# Patient Record
Sex: Female | Born: 1970 | Race: White | Hispanic: No | Smoking: Current every day smoker
Health system: Southern US, Community
[De-identification: ages and names within clinical notes are randomized; demographics above are authoritative.]

## PROBLEM LIST (undated history)

## (undated) DIAGNOSIS — D649 Anemia, unspecified: Secondary | ICD-10-CM

## (undated) DIAGNOSIS — R569 Unspecified convulsions: Secondary | ICD-10-CM

## (undated) DIAGNOSIS — G510 Bell's palsy: Secondary | ICD-10-CM

## (undated) HISTORY — PX: TUBAL LIGATION: SHX77

## (undated) HISTORY — PX: TONSILLECTOMY: SUR1361

---

## 2005-07-21 ENCOUNTER — Encounter: Admission: RE | Admit: 2005-07-21 | Discharge: 2005-07-21 | Payer: Self-pay | Admitting: Occupational Medicine

## 2006-06-14 ENCOUNTER — Inpatient Hospital Stay (HOSPITAL_COMMUNITY): Admission: RE | Admit: 2006-06-14 | Discharge: 2006-06-16 | Payer: Self-pay | Admitting: *Deleted

## 2006-06-15 ENCOUNTER — Encounter (INDEPENDENT_AMBULATORY_CARE_PROVIDER_SITE_OTHER): Payer: Self-pay | Admitting: *Deleted

## 2010-07-03 ENCOUNTER — Ambulatory Visit (HOSPITAL_COMMUNITY): Admission: RE | Admit: 2010-07-03 | Discharge: 2010-07-03 | Payer: Self-pay | Admitting: Family Medicine

## 2011-01-09 NOTE — Discharge Summary (Signed)
Brittany Hull, Brittany Hull               ACCOUNT NO.:  [id number]   MEDICAL RECORD NO.:  [id number]          PATIENT TYPE:  INP   LOCATION:  9109                          FACILITY:  [facility]   PHYSICIAN:  Zenaida Niece, M.D.DATE OF BIRTH:  1970-09-28   DATE OF ADMISSION:  06/14/2006  DATE OF DISCHARGE:  06/16/2006                                 DISCHARGE SUMMARY   ADMISSION DIAGNOSES:  1. Intrauterine pregnancy at 40 weeks.  2. Advanced maternal age.  3. Desires surgical sterility.   DISCHARGE DIAGNOSES:  1. Intrauterine pregnancy at 40 weeks.  2. Advanced maternal age.  3. Desires surgical sterility.  4. Meconium stained amniotic fluid.   PROCEDURES:  On June 14, 2006, she had a spontaneous vaginal delivery and  on June 15, 2006 she had a postpartum tubal ligation.   HISTORY AND PHYSICAL:  This is a 39 year old white female, gravida 4, para 2-  1-0-2 with an EGA of [redacted] weeks who presents for elective induction.  Prenatal  care complicated by the fact that she transferred to our group at  approximately 15 weeks.  She had advanced maternal age with normal  ultrasound and normal quad screen and declined amniocentesis.  She has a  history of having a baby with spina bifida but had normal ultrasound.  She  had recent size less than dates with reactive nonstress test.   PRENATAL LABS:  Blood type is A positive with a negative antibody screen.  RPR nonreactive.  Rubella immuned.  Hepatitis B surface antigen negative.  HIV negative.  Gonorrhea and Chlamydia negative.  Quad screen is normal.  One-hour Glucola is 58 and group B strep is negative.   PAST OB HISTORY:  Two vaginal deliveries at term without complications that  weighed 9 pounds 8 ounces and 8 pounds 1 ounce.  She also had one vaginal  delivery at approximately 5-1/2 months that was a stillbirth with spina  bifida.   PAST HISTORY:  Essentially noncontributory.   PHYSICAL EXAMINATION:  She is afebrile with stable vital  signs.  Fetal heart  tracing is reactive with irregular contractions.  Abdomen is gravid and  nontender with an estimated fetal weight of seven pounds.  Cervix is 3+, 60,  -1 to -2 with vertex presentation and adequate pelvis.  Amniotomy reveals  moderate meconium.   HOSPITAL COURSE:  The patient was admitted and had amniotomy performed for  induction.  She progressed into labor and received an epidural.  She  progressed to complete, pushed well and on the afternoon of June 14, 2006  had a vaginal delivery of a viable female infant with Apgar's of 9 and 9 that  weighed 7 pounds 11 ounces.  Dr. Francine Graven attended the delivery and the  baby did fine.  Placenta delivered spontaneously, was intact and was sent  for cord blood collection.  She had two small perineal abrasions which were  hemostatic and not repaired.  She had a 2 cm posterior vaginal cyst which  ruptured sebaceous material with delivery.  Estimated blood loss was 500 mL.   Postpartum, she had no significant complications  and did wish to proceed  with tubal ligation.  Predelivery hemoglobin 13.4, postdelivery 11.1.  On  the afternoon of postpartum day #2, Dr. Ellyn Hack did perform postpartum tubal  ligation under epidural anesthesia without complications.  On the morning of  postpartum day #2, she was felt to be stable enough for discharge home.   DISCHARGE INSTRUCTIONS:  1. Regular diet.  2. Pelvic rest.  3. Follow up in six weeks.   MEDICATIONS:  1. Percocet #20 1-2 p.o. q.4-6h. p.r.n. pain  2. Over-the-counter ibuprofen as needed.  She is given our discharge      pamphlet.      Zenaida Niece, M.D.  Electronically Signed     TDM/MEDQ  D:  06/16/2006  T:  06/17/2006  Job:  [id number]

## 2011-01-09 NOTE — Op Note (Signed)
Brittany Hull, Brittany Hull               ACCOUNT NO.:  [id number]   MEDICAL RECORD NO.:  [id number]          PATIENT TYPE:  INP   LOCATION:  9109                          FACILITY:  [facility]   PHYSICIAN:  Sherron Monday, MD        DATE OF BIRTH:  05-Apr-1971   DATE OF PROCEDURE:  06/15/2006  DATE OF DISCHARGE:                                 OPERATIVE REPORT   PREOPERATIVE DIAGNOSIS:  Undesired fertility, multiparity.   POSTOPERATIVE DIAGNOSIS:  Undesired fertility, multiparity.   OPERATION PERFORMED:  Postpartum bilateral tubal ligation by Pomeroy method.   SURGEON:  Sherron Monday, MD   ANESTHESIA:  Epidural.   COMPLICATIONS:  None.   PATHOLOGY:  Right tubal segment, left tubal segment.   FINDINGS:  Bilateral normal tubes and ovaries, normal postpartum uterus.   ESTIMATED BLOOD LOSS:  Minimal.   IV FLUIDS:  1100 mL.   URINE OUTPUT:  100 mL by in and out cath.   DISPOSITION:  Stable to PACU.   DESCRIPTION OF PROCEDURE:  After informed consent was reviewed with the  patient including risks, benefits and alternatives of the procedure, as well  as a less than 1 out of 200 risk of failure, patient was taken to the  operating room after her epidural had been redosed.  She was then prepped  and draped in the normal sterile fashion.  An approximately 2 cm  infraumbilical incision was made, carried through the underlying layer of  fascia sharply.  The fascia was then elevated with Kocher clamps and incised  using Mayo scissors.  This incision was extended and the peritoneum was  easily identified.  Using a moist tape sponge, the bowels were packed away.  The tube was easily identified, followed out to the fimbriated end.  Using  the Pomeroy method the tube was doubly ligated with two sutures of 0 plain  gut.  The tubal segment was excised and sent to pathology.  Hemostasis was  assured.  This tube was then returned to the abdomen.  The tape sponge was  used to again pack away the bowel and the  right tube was easily identified  and followed out to the fimbriated end and in similar fashion was ligated  using Pomeroy method.  It was doubly ligated using 0 plain gut and the  intervening portion was excised and sent to pathology.  The pack was  removed from the bowel.  The fascia was reapproximated using 0 Vicryl.  The  skin was closed using 3-0 Vicryl.  The patient tolerated the procedure well.  Sponge, lap and needle counts were correct x2 at the end of the procedure.  At the end of the procedure she was taken in stable condition to the PACU.      Sherron Monday, MD  Electronically Signed     JB/MEDQ  D:  06/15/2006  T:  06/16/2006  Job:  [id number]

## 2011-06-02 ENCOUNTER — Other Ambulatory Visit (HOSPITAL_COMMUNITY): Payer: Self-pay | Admitting: Family Medicine

## 2011-06-02 DIAGNOSIS — Z1231 Encounter for screening mammogram for malignant neoplasm of breast: Secondary | ICD-10-CM

## 2011-07-06 ENCOUNTER — Ambulatory Visit (HOSPITAL_COMMUNITY): Payer: Self-pay | Attending: Family Medicine

## 2014-07-15 ENCOUNTER — Encounter (HOSPITAL_COMMUNITY): Payer: Self-pay | Admitting: Family Medicine

## 2014-07-15 ENCOUNTER — Emergency Department (HOSPITAL_COMMUNITY): Payer: Self-pay

## 2014-07-15 ENCOUNTER — Emergency Department (HOSPITAL_COMMUNITY)
Admission: EM | Admit: 2014-07-15 | Discharge: 2014-07-15 | Disposition: A | Discharge status: Home / Self Care | Payer: Self-pay | Attending: Emergency Medicine | Admitting: Emergency Medicine

## 2014-07-15 DIAGNOSIS — G510 Bell's palsy: Secondary | ICD-10-CM | POA: Insufficient documentation

## 2014-07-15 DIAGNOSIS — R531 Weakness: Secondary | ICD-10-CM | POA: Insufficient documentation

## 2014-07-15 DIAGNOSIS — Z720 Tobacco use: Secondary | ICD-10-CM | POA: Insufficient documentation

## 2014-07-15 DIAGNOSIS — Z3202 Encounter for pregnancy test, result negative: Secondary | ICD-10-CM | POA: Insufficient documentation

## 2014-07-15 LAB — CBC
HCT: 42.3 % (ref 36.0–46.0)
Hemoglobin: 14.8 g/dL (ref 12.0–15.0)
MCH: 31.8 pg (ref 26.0–34.0)
MCHC: 35 g/dL (ref 30.0–36.0)
MCV: 90.8 fL (ref 78.0–100.0)
PLATELETS: 231 10*3/uL (ref 150–400)
RBC: 4.66 MIL/uL (ref 3.87–5.11)
RDW: 12.9 % (ref 11.5–15.5)
WBC: 7.5 10*3/uL (ref 4.0–10.5)

## 2014-07-15 LAB — COMPREHENSIVE METABOLIC PANEL
ALT: 9 U/L (ref 0–35)
AST: 12 U/L (ref 0–37)
Albumin: 4.3 g/dL (ref 3.5–5.2)
Alkaline Phosphatase: 51 U/L (ref 39–117)
Anion gap: 14 (ref 5–15)
BUN: 12 mg/dL (ref 6–23)
CO2: 24 mEq/L (ref 19–32)
Calcium: 9.5 mg/dL (ref 8.4–10.5)
Chloride: 102 mEq/L (ref 96–112)
Creatinine, Ser: 0.76 mg/dL (ref 0.50–1.10)
GFR calc non Af Amer: >90 mL/min (ref >90)
Glucose, Bld: 85 mg/dL (ref 70–99)
Potassium: 3.9 mEq/L (ref 3.7–5.3)
Sodium: 140 mEq/L (ref 137–147)
Total Bilirubin: 0.5 mg/dL (ref 0.3–1.2)
Total Protein: 7.2 g/dL (ref 6.0–8.3)

## 2014-07-15 LAB — DIFFERENTIAL
BASOS PCT: 0 % (ref 0–1)
Basophils Absolute: 0 10*3/uL (ref 0.0–0.1)
EOS PCT: 3 % (ref 0–5)
Eosinophils Absolute: 0.2 10*3/uL (ref 0.0–0.7)
LYMPHS ABS: 1.9 10*3/uL (ref 0.7–4.0)
Lymphocytes Relative: 25 % (ref 12–46)
Monocytes Absolute: 0.3 10*3/uL (ref 0.1–1.0)
Monocytes Relative: 4 % (ref 3–12)
Neutro Abs: 5 10*3/uL (ref 1.7–7.7)
Neutrophils Relative %: 68 % (ref 43–77)

## 2014-07-15 LAB — CBG MONITORING, ED: Glucose-Capillary: 84 mg/dL (ref 70–99)

## 2014-07-15 LAB — PROTIME-INR
INR: 1.11 (ref 0.00–1.49)
Prothrombin Time: 14.4 seconds (ref 11.6–15.2)

## 2014-07-15 LAB — I-STAT TROPONIN, ED: Troponin i, poc: 0.01 ng/mL (ref 0.00–0.08)

## 2014-07-15 LAB — APTT: aPTT: 33 seconds (ref 24–37)

## 2014-07-15 LAB — PREGNANCY, URINE: Preg Test, Ur: NEGATIVE

## 2014-07-15 MED ORDER — ARTIFICIAL TEARS OP OINT: TOPICAL_OINTMENT | OPHTHALMIC | Status: DC | PRN

## 2014-07-15 NOTE — ED Notes (Signed)
Pt having left facial numbness since 3 days ago. sts when she goes to drink the liquid drips.  Slight facial droop. Pt grip slightly weaker on the right. sts she has been having HA lately.

## 2014-07-15 NOTE — Discharge Instructions (Signed)
Bell's Palsy °Bell's palsy is a condition in which the muscles on one side of the face cannot move (paralysis). This is because the nerves in the face are paralyzed. It is most often thought to be caused by a virus. The virus causes swelling of the nerve that controls movement on one side of the face. The nerve travels through a tight space surrounded by bone. When the nerve swells, it can be compressed by the bone. This results in damage to the protective covering around the nerve. This damage interferes with how the nerve communicates with the muscles of the face. As a result, it can cause weakness or paralysis of the facial muscles.  °Injury (trauma), tumor, and surgery may cause Bell's palsy, but most of the time the cause is unknown. It is a relatively common condition. It starts suddenly (abrupt onset) with the paralysis usually ending within 2 days. Bell's palsy is not dangerous. But because the eye does not close properly, you may need care to keep the eye from getting dry. This can include splinting (to keep the eye shut) or moistening with artificial tears. Bell's palsy very seldom occurs on both sides of the face at the same time. °SYMPTOMS  °· Eyebrow sagging. °· Drooping of the eyelid and corner of the mouth. °· Inability to close one eye. °· Loss of taste on the front of the tongue. °· Sensitivity to loud noises. °TREATMENT  °The treatment is usually non-surgical. If the patient is seen within the first 24 to 48 hours, a short course of steroids may be prescribed, in an attempt to shorten the length of the condition. Antiviral medicines may also be used with the steroids, but it is unclear if they are helpful.  °You will need to protect your eye, if you cannot close it. The cornea (clear covering over your eye) will become dry and can be damaged. Artificial tears can be used to keep your eye moist. Glasses or an eye patch should be worn to protect your eye. °PROGNOSIS  °Recovery is variable, ranging  from days to months. Although the problem usually goes away completely (about 80% of cases resolve), predicting the outcome is impossible. Most people improve within 3 weeks of when the symptoms began. Improvement may continue for 3 to 6 months. A small number of people have moderate to severe weakness that is permanent.  °HOME CARE INSTRUCTIONS  °· If your caregiver prescribed medication to reduce swelling in the nerve, use as directed. Do not stop taking the medication unless directed by your caregiver. °· Use moisturizing eye drops as needed to prevent drying of your eye, as directed by your caregiver. °· Protect your eye, as directed by your caregiver. °· Use facial massage and exercises, as directed by your caregiver. °· Perform your normal activities, and get your normal rest. °SEEK IMMEDIATE MEDICAL CARE IF:  °· There is pain, redness or irritation in the eye. °· You or your child has an oral temperature above 102° F (38.9° C), not controlled by medicine. °MAKE SURE YOU:  °· Understand these instructions. °· Will watch your condition. °· Will get help right away if you are not doing well or get worse. °Document Released: 08/10/2005 Document Revised: 11/02/2011 Document Reviewed: 11/17/2013 °ExitCare® Patient Information ©2015 [employer]. This information is not intended to replace advice given to you by your health care provider. Make sure you discuss any questions you have with your health care provider. ° °

## 2014-07-15 NOTE — ED Provider Notes (Signed)
CSN: [id number]     Arrival date & time 07/15/14  1650 History   First MD Initiated Contact with Patient 07/15/14 1719     Chief Complaint  Patient presents with  . Numbness     (Consider location/radiation/quality/duration/timing/severity/associated sxs/prior Treatment) HPI Comments: Patient here with 3 day history of left-sided facial numbness. Notes change in sensation of taste. Also notes trouble closing her left eye. Denies any weakness in her arms or legs. Has had some sinus headache. No neck pain. No fever or chills. No nausea vomiting. Called EMS today and they noted some left upper extremity weakness. Symptoms of been persistent and nothing makes them better or worse. No tumor used prior to arrival. No prior history of same.  The history is provided by the patient and a friend.    History reviewed. No pertinent past medical history. History reviewed. No pertinent past surgical history. History reviewed. No pertinent family history. History  Substance Use Topics  . Smoking status: Current Every Day Smoker  . Smokeless tobacco: Not on file  . Alcohol Use: Not on file   OB History    No data available     Review of Systems  All other systems reviewed and are negative.     Allergies  Review of patient's allergies indicates no known allergies.  Home Medications   Prior to Admission medications   Medication Sig Start Date End Date Taking? Authorizing Provider  acetaminophen (TYLENOL) 325 MG tablet Take 650 mg by mouth every 6 (six) hours as needed for moderate pain.   Yes Historical Provider, MD   BP 128/71 mmHg  Pulse 58  Temp(Src) 98.1 F (36.7 C)  Resp 16  Ht 5\' 10"  (1.778 m)  Wt 154 lb (69.854 kg)  BMI 22.10 kg/m2  SpO2 98% Physical Exam  Constitutional: She is oriented to person, place, and time. She appears well-developed and well-nourished.  Non-toxic appearance. No distress.  HENT:  Head: Normocephalic and atraumatic.  Eyes: Conjunctivae, EOM and  lids are normal. Pupils are equal, round, and reactive to light.  Neck: Normal range of motion. Neck supple. No tracheal deviation present. No thyroid mass present.  Cardiovascular: Normal rate, regular rhythm and normal heart sounds.  Exam reveals no gallop.   No murmur heard. Pulmonary/Chest: Effort normal and breath sounds normal. No stridor. No respiratory distress. She has no decreased breath sounds. She has no wheezes. She has no rhonchi. She has no rales.  Abdominal: Soft. Normal appearance and bowel sounds are normal. She exhibits no distension. There is no tenderness. There is no rebound and no CVA tenderness.  Musculoskeletal: Normal range of motion. She exhibits no edema or tenderness.  Neurological: She is alert and oriented to person, place, and time. She has normal strength. A cranial nerve deficit is present. No sensory deficit. Coordination normal. GCS eye subscore is 4. GCS verbal subscore is 5. GCS motor subscore is 6.  Reflex Scores:      Patellar reflexes are 2+ on the right side and 2+ on the left side. Left-sided facial droop noted.  Skin: Skin is warm and dry. No abrasion and no rash noted.  Psychiatric: She has a normal mood and affect. Her speech is normal and behavior is normal.  Nursing note and vitals reviewed.   ED Course  Procedures (including critical care time) Labs Review Labs Reviewed  PROTIME-INR  APTT  CBC  DIFFERENTIAL  COMPREHENSIVE METABOLIC PANEL  PREGNANCY, URINE  I-STAT TROPOININ, ED  CBG MONITORING, ED  Imaging Review No results found.   EKG Interpretation None      MDM   Final diagnoses:  Weakness    Patient's head CT and MRI without acute findings. Patient's symptoms consistent with Bell's palsy. Patient is 3 days past her symptoms and is house-brackman class 2 and therefore requires no treatment with glucocorticoids or antivirals. Will be given neurology follow-up    Toy BakerAnthony T Antonia Culbertson, MD 07/15/14 2055

## 2015-10-19 ENCOUNTER — Encounter (HOSPITAL_COMMUNITY): Payer: Self-pay | Admitting: Emergency Medicine

## 2015-10-19 ENCOUNTER — Emergency Department (HOSPITAL_COMMUNITY): Payer: Self-pay

## 2015-10-19 ENCOUNTER — Emergency Department (HOSPITAL_COMMUNITY)
Admission: EM | Admit: 2015-10-19 | Discharge: 2015-10-19 | Disposition: A | Discharge status: Home / Self Care | Payer: Self-pay | Attending: Emergency Medicine | Admitting: Emergency Medicine

## 2015-10-19 DIAGNOSIS — F17210 Nicotine dependence, cigarettes, uncomplicated: Secondary | ICD-10-CM | POA: Insufficient documentation

## 2015-10-19 DIAGNOSIS — Z3202 Encounter for pregnancy test, result negative: Secondary | ICD-10-CM | POA: Insufficient documentation

## 2015-10-19 DIAGNOSIS — R569 Unspecified convulsions: Secondary | ICD-10-CM

## 2015-10-19 DIAGNOSIS — Z862 Personal history of diseases of the blood and blood-forming organs and certain disorders involving the immune mechanism: Secondary | ICD-10-CM | POA: Insufficient documentation

## 2015-10-19 DIAGNOSIS — R251 Tremor, unspecified: Secondary | ICD-10-CM | POA: Insufficient documentation

## 2015-10-19 HISTORY — DX: Anemia, unspecified: D64.9

## 2015-10-19 LAB — URINALYSIS, ROUTINE W REFLEX MICROSCOPIC
Bilirubin Urine: NEGATIVE
GLUCOSE, UA: NEGATIVE mg/dL
Hgb urine dipstick: NEGATIVE
Ketones, ur: NEGATIVE mg/dL
Nitrite: NEGATIVE
Protein, ur: NEGATIVE mg/dL
Specific Gravity, Urine: 1.008 (ref 1.005–1.030)
pH: 6.5 (ref 5.0–8.0)

## 2015-10-19 LAB — CBC
HCT: 40.1 % (ref 36.0–46.0)
HEMOGLOBIN: 13.3 g/dL (ref 12.0–15.0)
MCH: 30 pg (ref 26.0–34.0)
MCHC: 33.2 g/dL (ref 30.0–36.0)
MCV: 90.3 fL (ref 78.0–100.0)
PLATELETS: 206 10*3/uL (ref 150–400)
RBC: 4.44 MIL/uL (ref 3.87–5.11)
RDW: 12.8 % (ref 11.5–15.5)
WBC: 10.2 10*3/uL (ref 4.0–10.5)

## 2015-10-19 LAB — I-STAT BETA HCG BLOOD, ED (MC, WL, AP ONLY): I-stat hCG, quantitative: <5 m[IU]/mL (ref <5)

## 2015-10-19 LAB — BASIC METABOLIC PANEL
Anion gap: 14 (ref 5–15)
BUN: 18 mg/dL (ref 6–20)
CO2: 25 mmol/L (ref 22–32)
Calcium: 9.5 mg/dL (ref 8.9–10.3)
Chloride: 102 mmol/L (ref 101–111)
Creatinine, Ser: 0.86 mg/dL (ref 0.44–1.00)
GFR calc Af Amer: >60 mL/min (ref >60)
GFR calc non Af Amer: >60 mL/min (ref >60)
Glucose, Bld: 100 mg/dL — ABNORMAL HIGH (ref 65–99)
Potassium: 3.5 mmol/L (ref 3.5–5.1)
Sodium: 141 mmol/L (ref 135–145)

## 2015-10-19 LAB — CBG MONITORING, ED: Glucose-Capillary: 107 mg/dL — ABNORMAL HIGH (ref 65–99)

## 2015-10-19 LAB — URINE MICROSCOPIC-ADD ON

## 2015-10-19 MED ORDER — LEVETIRACETAM 500 MG PO TABS: 500.0000 mg | ORAL_TABLET | Freq: Two times a day (BID) | ORAL | Status: DC

## 2015-10-19 MED ORDER — SODIUM CHLORIDE 0.9 % IV SOLN
1000.0000 mg | Freq: Once | INTRAVENOUS | Status: AC
  Administered 2015-10-19: 1000 mg via INTRAVENOUS
  Filled 2015-10-19: qty 10

## 2015-10-19 MED ORDER — LORAZEPAM 2 MG/ML IJ SOLN
1.0000 mg | Freq: Once | INTRAMUSCULAR | Status: AC
  Administered 2015-10-19: 1 mg via INTRAVENOUS
  Filled 2015-10-19: qty 1

## 2015-10-19 NOTE — ED Provider Notes (Signed)
CSN: [id number]     Arrival date & time 10/19/15  6962 History  By signing my name below, I, Doreatha Martin, attest that this documentation has been prepared under the direction and in the presence of Shon Baton, MD. Electronically Signed: Doreatha Martin, ED Scribe. 10/19/2015. 1:18 AM.    Chief Complaint  Patient presents with  . Seizures   The history is provided by the patient and a relative. No language interpreter was used.    HPI Comments: Brittany Hull is a 45 y.o. female brought in by ambulance with h/o Bells Palsy who presents to the Emergency Department due to four episodes of seizure-like activity tonight. Family reports full body convulsions, lasting 30-45 seconds at a time with 1 minute in between. Pt states she remembers being on the porch smoking a cigarette when she began to feel dizzy. She reports the next thing she remembers is waking up in the ambulance. Per family, she was sitting down when she began to have an absent gaze. They note that her speech was not coherent and began to stand up and have an unsteady gait. Family states that they escorted her to a bed before she began to have the convulsions. They also note the pt seems to have slightly decreased cognition. H/o prior focal seizure with no formal work-up. No recent changes in medications, recent illness. Family reports recent increased stress at work. Pt denies HA, any current pains.  Denies drug or alcohol use.  Past Medical History  Diagnosis Date  . Anemia    Past Surgical History  Procedure Laterality Date  . Tubal ligation    . Tonsillectomy     History reviewed. No pertinent family history. Social History  Substance Use Topics  . Smoking status: Current Every Day Smoker -- 1.00 packs/day    Types: Cigarettes  . Smokeless tobacco: None  . Alcohol Use: No   OB History    No data available     Review of Systems  Constitutional: Negative for fever.  Respiratory: Negative for shortness of breath.    Cardiovascular: Negative for chest pain.  Gastrointestinal: Negative for abdominal pain.  Neurological: Positive for tremors and seizures. Negative for dizziness and headaches.  All other systems reviewed and are negative.  Allergies  Review of patient's allergies indicates no known allergies.  Home Medications   Prior to Admission medications   Medication Sig Start Date End Date Taking? Authorizing Provider  artificial tears (LACRILUBE) OINT ophthalmic ointment Place into the left eye every 3 (three) hours as needed for dry eyes. Patient not taking: Reported on 10/19/2015 07/15/14   Lorre Nick, MD  levETIRAcetam (KEPPRA) 500 MG tablet Take 1 tablet (500 mg total) by mouth 2 (two) times daily. 10/19/15   Shon Baton, MD   BP 95/44 mmHg  Pulse 59  Temp(Src) 98.2 F (36.8 C) (Oral)  Resp 14  Ht  (1.778 m)  Wt 161 lb (73.029 kg)  BMI 23.10 kg/m2  SpO2 94%  LMP 09/27/2015 Physical Exam  Constitutional: She is oriented to person, place, and time. She appears well-developed and well-nourished. No distress.  HENT:  Head: Normocephalic and atraumatic.  Eyes: Pupils are equal, round, and reactive to light.  Cardiovascular: Normal rate, regular rhythm and normal heart sounds.   No murmur heard. Pulmonary/Chest: Effort normal and breath sounds normal. No respiratory distress. She has no wheezes.  Abdominal: Soft. Bowel sounds are normal. There is no tenderness. There is no rebound.  Neurological: She is  alert and oriented to person, place, and time.  Cranial nerves II through XII intact, 5 out of 5 strength in all 4 extremities, normal gait  Skin: Skin is warm and dry.  Psychiatric: She has a normal mood and affect.  Nursing note and vitals reviewed.   ED Course  Procedures (including critical care time) DIAGNOSTIC STUDIES: Oxygen Saturation is 95% on RA, adequate by my interpretation.    COORDINATION OF CARE: 1:15 AM Discussed treatment plan with pt at bedside and  pt agreed to plan.  Labs Review Labs Reviewed  BASIC METABOLIC PANEL - Abnormal; Notable for the following:    Glucose, Bld 100 (*)    All other components within normal limits  URINALYSIS, ROUTINE W REFLEX MICROSCOPIC (NOT AT [facility]) - Abnormal; Notable for the following:    Leukocytes, UA TRACE (*)    All other components within normal limits  URINE MICROSCOPIC-ADD ON - Abnormal; Notable for the following:    Squamous Epithelial / LPF 6-30 (*)    Bacteria, UA RARE (*)    All other components within normal limits  CBG MONITORING, ED - Abnormal; Notable for the following:    Glucose-Capillary 107 (*)    All other components within normal limits  CBC  I-STAT BETA HCG BLOOD, ED ([facility], [facility], [facility] ONLY)  POC URINE PREG, ED    Imaging Review Ct Head Wo Contrast  10/19/2015  CLINICAL DATA:  Seizure. EXAM: CT HEAD WITHOUT CONTRAST TECHNIQUE: Contiguous axial images were obtained from the base of the skull through the vertex without intravenous contrast. COMPARISON:  Head CT and brain MRI 07/15/2014 FINDINGS: No intracranial hemorrhage, mass effect, or midline shift. No hydrocephalus. The basilar cisterns are patent. No evidence of territorial infarct. No intracranial fluid collection. Calvarium is intact. Included paranasal sinuses and mastoid air cells are well aerated. IMPRESSION: No acute intracranial abnormality. Electronically Signed   By: Rubye Oaks M.D.   On: 10/19/2015 03:38   I have personally reviewed and evaluated these images and lab results as part of my medical decision-making.   EKG Interpretation   Date/Time:  Saturday October 19 2015 16:10:96 EST Ventricular Rate:  58 PR Interval:  148 QRS Duration: 99 QT Interval:  436 QTC Calculation: 428 R Axis:   71 Text Interpretation:  Sinus rhythm Confirmed by HORTON  MD, COURTNEY  ([id number]) on 10/19/2015 5:19:08 AM      MDM   Final diagnoses:  Seizure-like activity (HCC)    Patient presents with seizure-like activity.  Reported 4 distinct episodes of 30-45 seconds with 1 minute in between. It is unclear whether she had a true postictal episode. Per EMS, they noted that the patient was shaking upon their arrival but woke up and did not appear to be postictal. Daughter has a son with seizures and is adamant that these were seizures. Patient has no known history. She is back to baseline and neurologically intact.  Basic labwork obtained as well as head CT. Patient was given Ativan to prevent any further episodes. Basic labwork is reassuring. CT is negative. Discussed with on-call neurology, Dr. Roseanne Reno. Given that the patient is back to baseline and it is unclear whether these are true seizure episodes, he recommends loading with IV Keppra and placing on Keppra twice a day. She will need outpatient neurology follow-up and workup. These orders were placed. On recheck, patient is sleepy post Ativan but otherwise continues to be neurologically intact. No recurrent seizure activity in the emergency room. She ambulates without difficulty  and is able to tolerate fluids. Discussed with patient that she needs outpatient neurology workup. She is not to drive or operate heavy machinery until cleared by neurology.  After history, exam, and medical workup I feel the patient has been appropriately medically screened and is safe for discharge home. Pertinent diagnoses were discussed with the patient. Patient was given return precautions.  I personally performed the services described in this documentation, which was scribed in my presence. The recorded information has been reviewed and is accurate.   Shon Baton, MD 10/19/15 [phone]

## 2015-10-19 NOTE — ED Notes (Signed)
Pt brought to Ed by [facility] from home after having seizures x 4 that family member states last 45 sec each, on [facility] arrival pt started shaking on her bed and stopped on her own no post ictal state noticed. Pt is AO x 4 NAD noticed. Pt denies any drugs or alcohol use, state she is not been diagnose with seizures before, but had a similar episode before secondary to stress. VS BP 124/78, HR 72, R, 20, SPO2 98%, NSR on monitor, CBG 83.

## 2015-10-19 NOTE — ED Notes (Signed)
Patient transported to CT 

## 2015-10-19 NOTE — ED Notes (Signed)
CBG 107 on ED arrival

## 2015-10-19 NOTE — Discharge Instructions (Signed)
You were seen today for possible seizure activity. He will be started on a seizure medication. You should not drive or operate heavy machinery until cleared by neurologist. This was provided for you in your discharge paperwork.  Seizure, Adult A seizure is abnormal electrical activity in the brain. Seizures usually last from 30 seconds to 2 minutes. There are various types of seizures. Before a seizure, you may have a warning sensation (aura) that a seizure is about to occur. An aura may include the following symptoms:   Fear or anxiety.  Nausea.  Feeling like the room is spinning (vertigo).  Vision changes, such as seeing flashing lights or spots. Common symptoms during a seizure include:  A change in attention or behavior (altered mental status).  Convulsions with rhythmic jerking movements.  Drooling.  Rapid eye movements.  Grunting.  Loss of bladder and bowel control.  Bitter taste in the mouth.  Tongue biting. After a seizure, you may feel confused and sleepy. You may also have an injury resulting from convulsions during the seizure. HOME CARE INSTRUCTIONS   If you are given medicines, take them exactly as prescribed by your health care provider.  Keep all follow-up appointments as directed by your health care provider.  Do not swim or drive or engage in risky activity during which a seizure could cause further injury to you or others until your health care provider says it is OK.  Get adequate rest.  Teach friends and family what to do if you have a seizure. They should:  Lay you on the ground to prevent a fall.  Put a cushion under your head.  Loosen any tight clothing around your neck.  Turn you on your side. If vomiting occurs, this helps keep your airway clear.  Stay with you until you recover.  Know whether or not you need emergency care. SEEK IMMEDIATE MEDICAL CARE IF:  The seizure lasts longer than 5 minutes.  The seizure is severe or you do not  wake up immediately after the seizure.  You have an altered mental status after the seizure.  You are having more frequent or worsening seizures. Someone should drive you to the emergency department or call local emergency services (911 in [place] MAKE SURE YOU:  Understand these instructions.  Will watch your condition.  Will get help right away if you are not doing well or get worse.   This information is not intended to replace advice given to you by your health care provider. Make sure you discuss any questions you have with your health care provider.   Document Released: 08/07/2000 Document Revised: 08/31/2014 Document Reviewed: 03/22/2013 Elsevier Interactive Patient Education [employer]

## 2017-05-30 ENCOUNTER — Encounter (HOSPITAL_COMMUNITY): Payer: Self-pay | Admitting: Emergency Medicine

## 2017-05-30 ENCOUNTER — Emergency Department (HOSPITAL_COMMUNITY): Payer: Self-pay

## 2017-05-30 ENCOUNTER — Emergency Department (HOSPITAL_COMMUNITY)
Admission: EM | Admit: 2017-05-30 | Discharge: 2017-05-30 | Disposition: A | Discharge status: Home / Self Care | Payer: Self-pay | Attending: Emergency Medicine | Admitting: Emergency Medicine

## 2017-05-30 DIAGNOSIS — Z79899 Other long term (current) drug therapy: Secondary | ICD-10-CM | POA: Insufficient documentation

## 2017-05-30 DIAGNOSIS — F17210 Nicotine dependence, cigarettes, uncomplicated: Secondary | ICD-10-CM | POA: Insufficient documentation

## 2017-05-30 DIAGNOSIS — R310 Gross hematuria: Secondary | ICD-10-CM | POA: Insufficient documentation

## 2017-05-30 LAB — URINALYSIS, ROUTINE W REFLEX MICROSCOPIC
Bilirubin Urine: NEGATIVE
Glucose, UA: NEGATIVE mg/dL
Ketones, ur: NEGATIVE mg/dL
Leukocytes, UA: NEGATIVE
Nitrite: NEGATIVE
Protein, ur: 30 mg/dL — AB
SPECIFIC GRAVITY, URINE: 1.005 (ref 1.005–1.030)
pH: 6 (ref 5.0–8.0)

## 2017-05-30 LAB — CBC WITH DIFFERENTIAL/PLATELET
BASOS PCT: 0 %
Basophils Absolute: 0 10*3/uL (ref 0.0–0.1)
EOS PCT: 4 %
Eosinophils Absolute: 0.3 10*3/uL (ref 0.0–0.7)
HEMATOCRIT: 44.7 % (ref 36.0–46.0)
Hemoglobin: 15.4 g/dL — ABNORMAL HIGH (ref 12.0–15.0)
Lymphocytes Relative: 26 %
Lymphs Abs: 2.4 10*3/uL (ref 0.7–4.0)
MCH: 31.8 pg (ref 26.0–34.0)
MCHC: 34.5 g/dL (ref 30.0–36.0)
MCV: 92.4 fL (ref 78.0–100.0)
MONOS PCT: 4 %
Monocytes Absolute: 0.3 10*3/uL (ref 0.1–1.0)
NEUTROS ABS: 5.9 10*3/uL (ref 1.7–7.7)
Neutrophils Relative %: 66 %
PLATELETS: 253 10*3/uL (ref 150–400)
RBC: 4.84 MIL/uL (ref 3.87–5.11)
RDW: 13 % (ref 11.5–15.5)
WBC: 8.9 10*3/uL (ref 4.0–10.5)

## 2017-05-30 LAB — BASIC METABOLIC PANEL
Anion gap: 11 (ref 5–15)
BUN: 13 mg/dL (ref 6–20)
CALCIUM: 9.4 mg/dL (ref 8.9–10.3)
CO2: 28 mmol/L (ref 22–32)
Chloride: 101 mmol/L (ref 101–111)
Creatinine, Ser: 0.8 mg/dL (ref 0.44–1.00)
GFR calc Af Amer: >60 mL/min (ref >60)
GFR calc non Af Amer: >60 mL/min (ref >60)
GLUCOSE: 93 mg/dL (ref 65–99)
POTASSIUM: 4.7 mmol/L (ref 3.5–5.1)
Sodium: 140 mmol/L (ref 135–145)

## 2017-05-30 LAB — WET PREP, GENITAL
SPERM: NONE SEEN
TRICH WET PREP: NONE SEEN
YEAST WET PREP: NONE SEEN

## 2017-05-30 LAB — POC URINE PREG, ED: Preg Test, Ur: NEGATIVE

## 2017-05-30 NOTE — ED Triage Notes (Signed)
Pt reports she has had bleeding when she urinates for the past 3 days. Pt unsure if the blood is coming from her vagina or urethra. No pain with urination.

## 2017-05-30 NOTE — ED Notes (Signed)
Patient transported to CT 

## 2017-05-30 NOTE — Discharge Instructions (Signed)
Please make appointment with primary care doctor Please make appointment with urology

## 2017-05-30 NOTE — ED Provider Notes (Signed)
[facility]-EMERGENCY DEPT Provider Note   CSN: [id number] Arrival date & time: 05/30/17  4098     History   Chief Complaint Chief Complaint  Patient presents with  . Hematuria    HPI Brittany Hull is a 46 y.o. female who presents with hematuria. PMH significant for tobacco use and anemia. She states that she has had gross hematuria for the past three days every time she uses the bathroom. Sometimes it is red and sometimes pink tinged. She has not had this before. She denies any pain or dysuria. She denies any abdominal pain, N/V/D, bloody stools. She is worried about cancer since there is a strong family hx of multiple types of cancer. She states she is also not totally sure if it is coming from her urine vs vagina but notes she does not have bleeding in her underwear when she is not urinating. She has had a tubal ligation but still has her uterus. She has had abnormal Pap smears in the past requiring interventions. She is post-menopausal - LMP was a year and a half ago. She is not on blood thinners and she has not had any trauma.  HPI  Past Medical History:  Diagnosis Date  . Anemia     There are no active problems to display for this patient.   Past Surgical History:  Procedure Laterality Date  . TONSILLECTOMY    . TUBAL LIGATION      OB History    No data available       Home Medications    Prior to Admission medications   Medication Sig Start Date End Date Taking? Authorizing Provider  artificial tears (LACRILUBE) OINT ophthalmic ointment Place into the left eye every 3 (three) hours as needed for dry eyes. Patient not taking: Reported on 10/19/2015 07/15/14   Lorre Nick, MD  levETIRAcetam (KEPPRA) 500 MG tablet Take 1 tablet (500 mg total) by mouth 2 (two) times daily. 10/19/15   Horton, Mayer Masker, MD    Family History History reviewed. No pertinent family history.  Social History Social History  Substance Use Topics  . Smoking status: Current Every Day Smoker     Packs/day: 1.00    Types: Cigarettes  . Smokeless tobacco: Not on file  . Alcohol use No     Allergies   Patient has no known allergies.   Review of Systems Review of Systems  Gastrointestinal: Negative for abdominal pain, blood in stool, diarrhea, nausea and vomiting.  Genitourinary: Positive for hematuria. Negative for difficulty urinating, dyspareunia, dysuria, flank pain, pelvic pain and vaginal discharge.  All other systems reviewed and are negative.    Physical Exam Updated Vital Signs BP 132/69   Pulse (!) 57   Temp 98.2 F (36.8 C) (Oral)   Resp 16   Ht  (1.778 m)   Wt 73.9 kg (163 lb)   SpO2 99%   BMI 23.39 kg/m   Physical Exam  Constitutional: She is oriented to person, place, and time. She appears well-developed and well-nourished. No distress.  HENT:  Head: Normocephalic and atraumatic.  Eyes: Pupils are equal, round, and reactive to light. Conjunctivae are normal. Right eye exhibits no discharge. Left eye exhibits no discharge. No scleral icterus.  Neck: Normal range of motion.  Cardiovascular: Normal rate and regular rhythm.  Exam reveals no gallop and no friction rub.   No murmur heard. Pulmonary/Chest: Effort normal and breath sounds normal. No respiratory distress. She has no wheezes. She has no rales.  Abdominal:  Soft. Bowel sounds are normal. She exhibits no distension and no mass. There is no tenderness. There is no rebound and no guarding. No hernia.  Genitourinary:  Genitourinary Comments: Pelvic: No inguinal lymphadenopathy or inguinal hernia noted. Normal external genitalia. No pain with speculum insertion. Closed cervical os which appears irritated and red but is not friable. Whitish yellow mucoid discharge in vaginal vault. On bimanual examination no adnexal tenderness or cervical motion tenderness. Chaperone present during exam.     Neurological: She is alert and oriented to person, place, and time.  Skin: Skin is warm and dry.    Psychiatric: She has a normal mood and affect. Her behavior is normal.  Nursing note and vitals reviewed.    ED Treatments / Results  Labs (all labs ordered are listed, but only abnormal results are displayed) Labs Reviewed  WET PREP, GENITAL - Abnormal; Notable for the following:       Result Value   Clue Cells Wet Prep HPF POC PRESENT (*)    WBC, Wet Prep HPF POC MANY (*)    All other components within normal limits  URINALYSIS, ROUTINE W REFLEX MICROSCOPIC - Abnormal; Notable for the following:    Color, Urine RED (*)    APPearance HAZY (*)    Hgb urine dipstick LARGE (*)    Protein, ur 30 (*)    Bacteria, UA RARE (*)    Squamous Epithelial / LPF 0-5 (*)    All other components within normal limits  BASIC METABOLIC PANEL  CBC WITH DIFFERENTIAL/PLATELET  POC URINE PREG, ED  GC/CHLAMYDIA PROBE AMP ([place]) NOT AT [facility]    EKG  EKG Interpretation None       Radiology Ct Renal Stone Study  Result Date: 05/30/2017 CLINICAL DATA:  Pt reports she has had bleeding when she urinates for the past 3 days. Pt unsure if the blood is coming from her vagina or urethra. No pain with urination EXAM: CT ABDOMEN AND PELVIS WITHOUT CONTRAST TECHNIQUE: Multidetector CT imaging of the abdomen and pelvis was performed following the standard protocol without IV contrast. COMPARISON:  None. FINDINGS: Lower chest: No acute findings. Two adjacent pleural based nodules in left lower lobe, largest measuring 7 mm, image 12, series 6. Heart is normal in size. Hepatobiliary: There is a well-defined oval cyst, average Hounsfield units of 1, that indents the posterior segment of the right liver lobe, lying just above the right kidney, but separate from the kidney. This measures 7.1 x 4.6 x 6.0 cm. This could potentially arise from the liver. No other evidence of a liver abnormality. Normal gallbladder. No bile duct dilation. Pancreas: Unremarkable. No pancreatic ductal dilatation or surrounding  inflammatory changes. Spleen: Normal in size without focal abnormality. Adrenals/Urinary Tract: No adrenal masses. Two small nonobstructing stones in the right kidney. No left intrarenal stones. Small, 6 mm, hyperattenuating mass arises from the lower pole the left kidney average Hounsfield units of 90, consistent with a cyst complicated by previous hemorrhage. No other renal masses. No hydronephrosis. Normal ureters. Bladder is mostly decompressed but otherwise unremarkable. Stomach/Bowel: Stomach is within normal limits. Appendix appears normal. No evidence of bowel wall thickening, distention, or inflammatory changes. Vascular/Lymphatic: Aortic atherosclerosis. No enlarged abdominal or pelvic lymph nodes. Reproductive: Uterus and bilateral adnexa are unremarkable. Other: No abdominal wall hernia or abnormality. No abdominopelvic ascites. Musculoskeletal: No fracture or acute finding. No osteoblastic or osteolytic lesions. Disc degenerative changes at L5-S1. IMPRESSION: 1. No acute findings within the abdomen or pelvis. No  findings to account for the patient's symptoms. 2. There are 2 small nonobstructing stones in the right kidney. No ureteral stones or obstructive uropathy. 3. Two small pleural based nodules in the left lower lobe, largest measuring 7 mm. Non-contrast chest CT at 3-6 months is recommended. If the nodules are stable at time of repeat CT, then future CT at 18-24 months (from today's scan) is considered optional for low-risk patients, but is recommended for high-risk patients. This recommendation follows the consensus statement: Guidelines for Management of Incidental Pulmonary Nodules Detected on CT Images: From the Fleischner Society 2017; Radiology 2017; 284:228-243. 4. 7 cm cyst lies between the right kidney and posterior segment of the right liver lobe. This does not appear to arise from the kidney. It may arise from the liver. This is unlikely to be of clinical significance. 5. Mild aortic  atherosclerosis. Electronically Signed   By: Amie Portland M.D.   On: 05/30/2017 14:36    Procedures Procedures (including critical care time)  Medications Ordered in ED Medications - No data to display   Initial Impression / Assessment and Plan / ED Course  I have reviewed the triage vital signs and the nursing notes.  Pertinent labs & imaging results that were available during my care of the patient were reviewed by me and considered in my medical decision making (see chart for details).  46 year old female with hematuria. Unclear etiology. Vitals are normal. She denies any pain. Abdomen is soft, benign. Pelvic is remarkable for irritated cervix but there was no blood in vaginal vault or coming from cervix. UA shows large hgb, 30 protein, TNTC RBC. Does not appear infected. Labs are unremarkable. CT is remarkable for liver cysts, renal cyst, and lung nodules. Discussed results with patient who is very tearful. I stressed to her that this is not a diagnosis of cancer and it needs follow up. Primary care f/u as well as urology f/u was given. Return precautions given.  Final Clinical Impressions(s) / ED Diagnoses   Final diagnoses:  Gross hematuria    New Prescriptions New Prescriptions   No medications on file     Beryle Quant 05/30/17 1552    Linwood Dibbles, MD 05/30/17 1616

## 2017-05-31 LAB — GC/CHLAMYDIA PROBE AMP (~~LOC~~) NOT AT ARMC
CHLAMYDIA, DNA PROBE: NEGATIVE
NEISSERIA GONORRHEA: NEGATIVE

## 2018-06-28 ENCOUNTER — Emergency Department (HOSPITAL_COMMUNITY)
Admission: EM | Admit: 2018-06-28 | Discharge: 2018-06-28 | Disposition: A | Discharge status: Home / Self Care | Payer: Self-pay | Attending: Emergency Medicine | Admitting: Emergency Medicine

## 2018-06-28 ENCOUNTER — Emergency Department (HOSPITAL_COMMUNITY): Payer: Self-pay

## 2018-06-28 ENCOUNTER — Encounter (HOSPITAL_COMMUNITY): Payer: Self-pay | Admitting: Emergency Medicine

## 2018-06-28 DIAGNOSIS — R220 Localized swelling, mass and lump, head: Secondary | ICD-10-CM | POA: Insufficient documentation

## 2018-06-28 DIAGNOSIS — F17210 Nicotine dependence, cigarettes, uncomplicated: Secondary | ICD-10-CM | POA: Insufficient documentation

## 2018-06-28 DIAGNOSIS — R413 Other amnesia: Secondary | ICD-10-CM | POA: Insufficient documentation

## 2018-06-28 HISTORY — DX: Unspecified convulsions: R56.9

## 2018-06-28 HISTORY — DX: Bell's palsy: G51.0

## 2018-06-28 LAB — BASIC METABOLIC PANEL
ANION GAP: 8 (ref 5–15)
BUN: 14 mg/dL (ref 6–20)
CALCIUM: 9.3 mg/dL (ref 8.9–10.3)
CO2: 31 mmol/L (ref 22–32)
Chloride: 103 mmol/L (ref 98–111)
Creatinine, Ser: 0.72 mg/dL (ref 0.44–1.00)
Glucose, Bld: 84 mg/dL (ref 70–99)
Potassium: 3.7 mmol/L (ref 3.5–5.1)
SODIUM: 142 mmol/L (ref 135–145)

## 2018-06-28 LAB — CBC
HCT: 45.9 % (ref 36.0–46.0)
HEMOGLOBIN: 15.2 g/dL — AB (ref 12.0–15.0)
MCH: 31.3 pg (ref 26.0–34.0)
MCHC: 33.1 g/dL (ref 30.0–36.0)
MCV: 94.6 fL (ref 80.0–100.0)
NRBC: 0 % (ref 0.0–0.2)
PLATELETS: 240 10*3/uL (ref 150–400)
RBC: 4.85 MIL/uL (ref 3.87–5.11)
RDW: 12.7 % (ref 11.5–15.5)
WBC: 9.7 10*3/uL (ref 4.0–10.5)

## 2018-06-28 MED ORDER — PENICILLIN V POTASSIUM 500 MG PO TABS: 500.0000 mg | ORAL_TABLET | Freq: Three times a day (TID) | ORAL | 0 refills | Status: AC

## 2018-06-28 NOTE — ED Provider Notes (Signed)
[facility]-EMERGENCY DEPT Provider Note   CSN: [id number] Arrival date & time: 06/28/18  1043     History   Chief Complaint Chief Complaint  Patient presents with  . Oral Swelling    HPI Brittany Hull is a 47 y.o. female.  HPI Patient is a 47 year old female presents the emergency department with complaints of right jaw swelling over the past week.  She has dental decay but denies significant focal dental pain.  She is also concerned about her memory which she feels like is been abnormal over the past 6 to 8 weeks.  She states she is somewhat in a days and forgets at times the combination to the safe at work and other such things that she never had issues remembering.  Denies weakness of her arms or legs.  She reports that she was recently told that she had 2 pulmonary nodules and will need follow-up imaging.  She continues to smoke cigarettes.  She denies fevers and chills.  No chest pain.  No exertional shortness of breath.  Denies abdominal pain.  No weight loss.  No nausea or vomiting.  She reports she is depressed.  She is tearful.  She is not homicidal or suicidal.  She states she works hard and has a lot of stress.  She currently is without health insurance which is adding to her stress.  She is working hard to try and provide for multiple family members and reports that at times it seems overwhelming.   Past Medical History:  Diagnosis Date  . Anemia   . Bell's palsy   . Seizures (HCC)     There are no active problems to display for this patient.   Past Surgical History:  Procedure Laterality Date  . TONSILLECTOMY    . TUBAL LIGATION       OB History   None      Home Medications    Prior to Admission medications   Medication Sig Start Date End Date Taking? Authorizing Provider  ACAI BERRY PO Take 1 tablet by mouth 3 (three) times daily with meals.    [provider]  ibuprofen (ADVIL,MOTRIN) 200 MG tablet Take 400 mg by mouth  every 6 (six) hours as needed for fever or headache.    [provider]  levETIRAcetam (KEPPRA) 500 MG tablet Take 1 tablet (500 mg total) by mouth 2 (two) times daily. Patient not taking: Reported on 05/30/2017 10/19/15   Horton, Mayer Masker, MD  penicillin v potassium (VEETID) 500 MG tablet Take 1 tablet (500 mg total) by mouth 3 (three) times daily. 06/28/18   Azalia Bilis, MD    Family History No family history on file.  Social History Social History   Tobacco Use  . Smoking status: Current Every Day Smoker    Packs/day: 1.00    Types: Cigarettes  . Smokeless tobacco: Never Used  Substance Use Topics  . Alcohol use: No  . Drug use: No     Allergies   Patient has no known allergies.   Review of Systems Review of Systems  All other systems reviewed and are negative.    Physical Exam Updated Vital Signs BP 111/73   Pulse 66   Temp 97.8 F (36.6 C) (Oral)   Resp 16   SpO2 100%   Physical Exam  Constitutional: She is oriented to person, place, and time. She appears well-developed and well-nourished. No distress.  HENT:  Head: Normocephalic and atraumatic.  Small pea-sized mobile mass  of the right jaw.  This is palpable.  No fluctuance.  No erythema.  Patient with dental decay but no focal dental tenderness.  No gingival swelling.  No appreciable intraoral lesion on the right side noted.  Anterior neck is normal.  No lymphadenopathy of the anterior of her posterior cervical chain.  No supraclavicular nodes noted.  Eyes: Pupils are equal, round, and reactive to light. EOM are normal.  Neck: Normal range of motion.  Cardiovascular: Normal rate, regular rhythm and normal heart sounds.  Pulmonary/Chest: Effort normal and breath sounds normal.  Abdominal: Soft. She exhibits no distension. There is no tenderness.  Musculoskeletal: Normal range of motion.  Neurological: She is alert and oriented to person, place, and time.  5/5 strength in major muscle groups of   bilateral upper and lower extremities. Speech normal. No facial asymetry.   Skin: Skin is warm and dry.  Psychiatric: She has a normal mood and affect. Judgment normal.  Nursing note and vitals reviewed.    ED Treatments / Results  Labs (all labs ordered are listed, but only abnormal results are displayed) Labs Reviewed  CBC - Abnormal; Notable for the following components:      Result Value   Hemoglobin 15.2 (*)    All other components within normal limits  BASIC METABOLIC PANEL    EKG None  Radiology Dg Chest 2 View  Result Date: 06/28/2018 CLINICAL DATA:  History of pulmonary nodules, some chest tightness over the last 3 months as well as shortness of breath, smoking history EXAM: CHEST - 2 VIEW COMPARISON:  None. FINDINGS: No active infiltrate or effusion is seen. The lungs are slightly hyperaerated. Mediastinal and hilar contours are unremarkable. The heart is within normal limits in size. Only mild degenerative changes present in the mid to lower thoracic spine. IMPRESSION: No active cardiopulmonary disease.  Mild hyper inflation. Electronically Signed   By: Dwyane Dee M.D.   On: 06/28/2018 11:38   Ct Head Wo Contrast  Result Date: 06/28/2018 CLINICAL DATA:  Memory loss. EXAM: CT HEAD WITHOUT CONTRAST TECHNIQUE: Contiguous axial images were obtained from the base of the skull through the vertex without intravenous contrast. COMPARISON:  10/19/2015 FINDINGS: Brain: No evidence of acute infarction, hemorrhage, hydrocephalus, extra-axial collection or mass lesion/mass effect. Small nodular calcification in the fourth ventricle is stable from 2015. Vascular: No hyperdense vessel or unexpected calcification. Skull: Normal. Negative for fracture or focal lesion. Sinuses/Orbits: Negative IMPRESSION: No acute finding or change from priors. Electronically Signed   By: Marnee Spring M.D.   On: 06/28/2018 11:58    Procedures Procedures (including critical care time)  Medications  Ordered in ED Medications - No data to display   Initial Impression / Assessment and Plan / ED Course  I have reviewed the triage vital signs and the nursing notes.  Pertinent labs & imaging results that were available during my care of the patient were reviewed by me and considered in my medical decision making (see chart for details).     No focal weakness.  No pulmonary nodules or abnormalities noted on chest x-ray.  Head CT is without mass or bleed.  Patient will be given outpatient primary care and neurology referrals.  I will place the patient on antibiotics for dental decay as this may be a lymph node and may be responding to a low-grade dental infection.  Hopefully this will improve over the next several weeks.  Patient understands that if the mass at the angle the right mandible  persists or increases in size it likely will benefit from ENT consultation and biopsy.  Patient is encouraged to return the emergency department for new or worsening symptoms  Final Clinical Impressions(s) / ED Diagnoses   Final diagnoses:  None    ED Discharge Orders         Ordered    penicillin v potassium (VEETID) 500 MG tablet  3 times daily     06/28/18 1216           Azalia Bilis, MD 06/28/18 1226

## 2018-06-28 NOTE — Discharge Instructions (Addendum)
Please stop smoking cigarettes  Follow-up with the ear nose and throat surgeon if the swelling in the right jaw worsens.  Take the antibiotics as prescribed.  Please develop a relationship with a primary care physician.

## 2018-06-28 NOTE — ED Triage Notes (Signed)
Pt c/o right lower dental swelling for week. Is painful with palpation.

## 2020-07-04 IMAGING — CT CT HEAD W/O CM
3 series · 16 of 46 positions shown, 19 images · non-contrast
Comparison: 10/19/2015

CLINICAL DATA: Memory loss.

EXAM:
CT HEAD WITHOUT CONTRAST
TECHNIQUE: Contiguous axial images were obtained from the base of the skull
through the vertex without intravenous contrast.

[Series 2: head wo · axial · 0.47mm/px · z∈[-137,-17]mm · 10 of 29 slices shown, 13 images]
[im 3/29  brain]
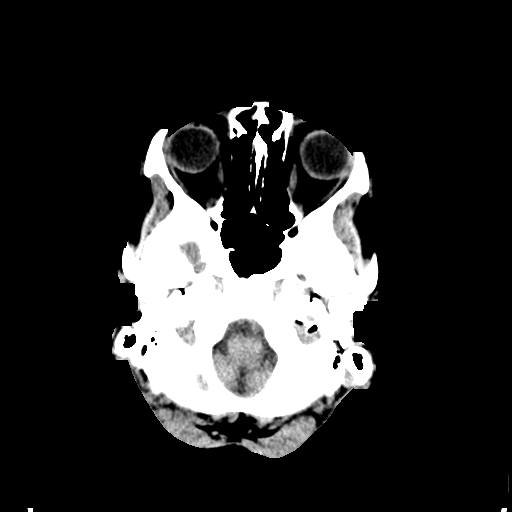
[im 3/29  bone]
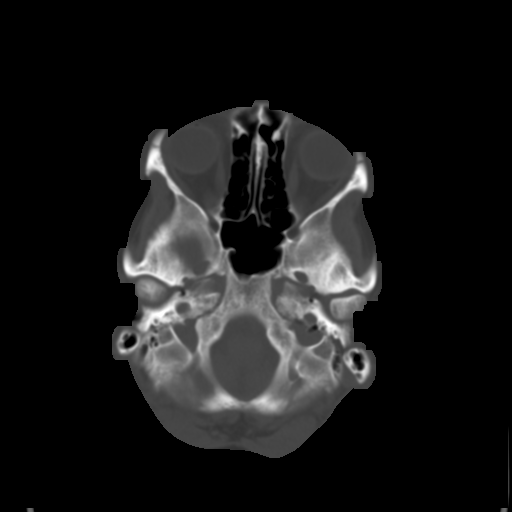
[im 6/29  brain]
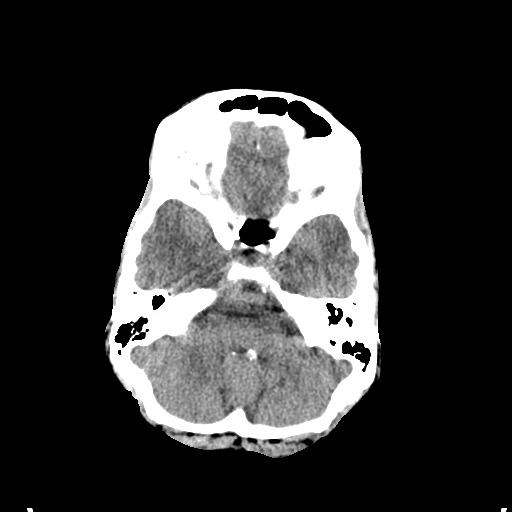
[im 8/29  brain]
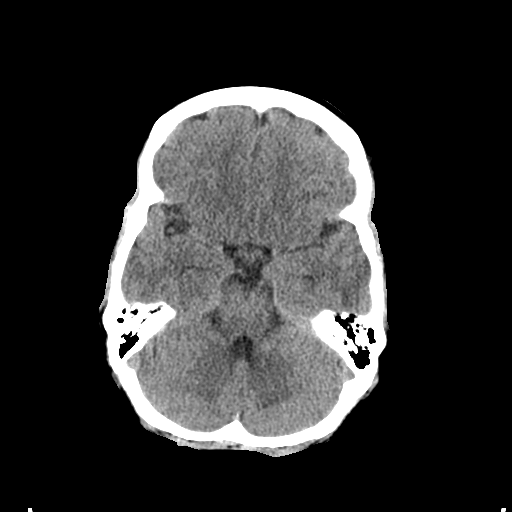
[im 11/29  brain]
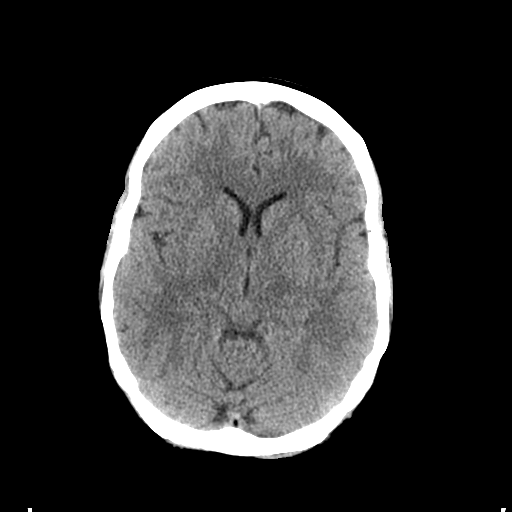
[im 14/29  brain]
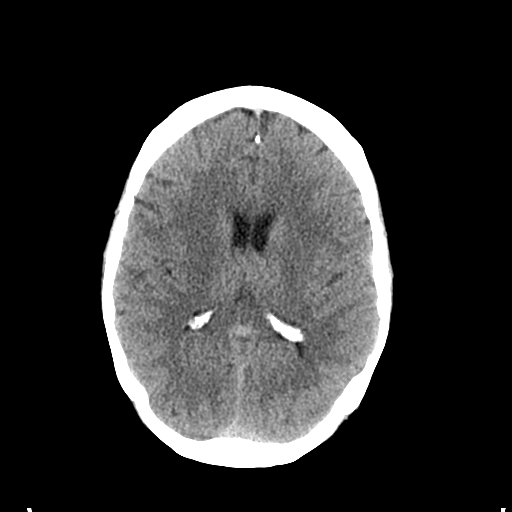
[im 14/29  bone]
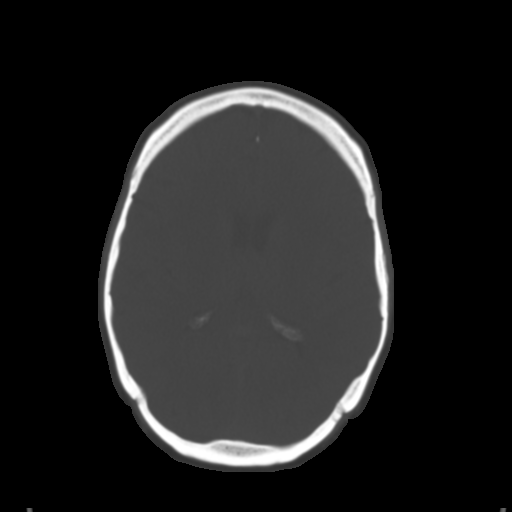
[im 16/29  brain]
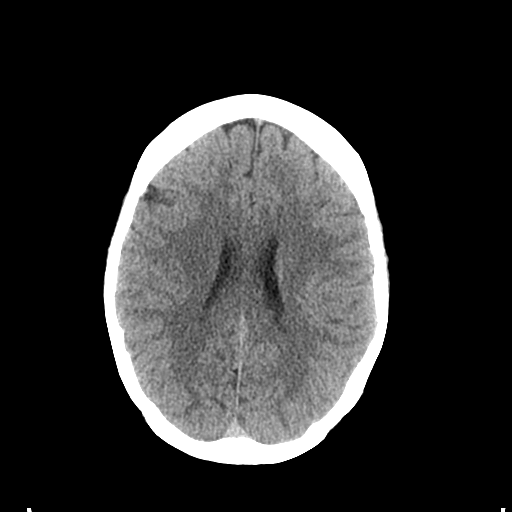
[im 19/29  brain]
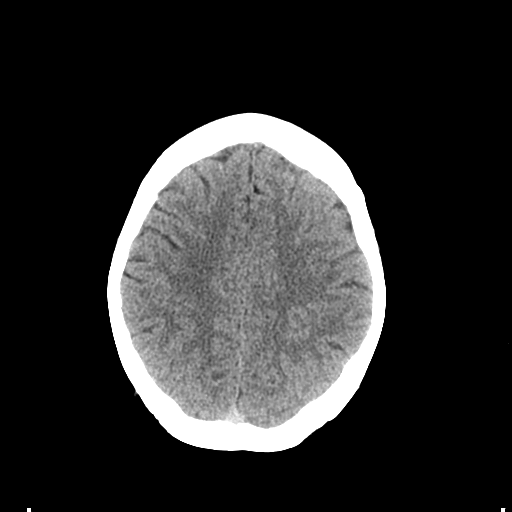
[im 22/29  brain]
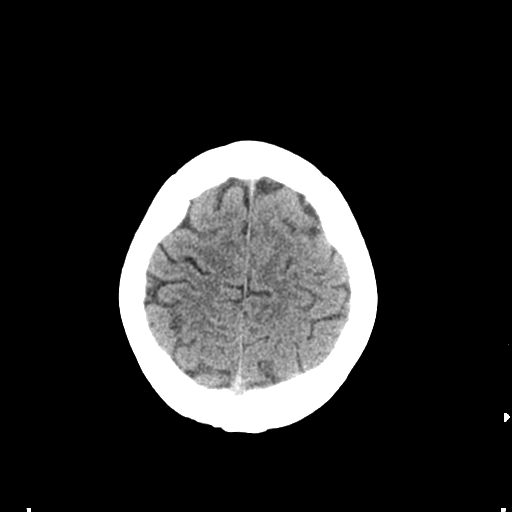
[im 24/29  brain]
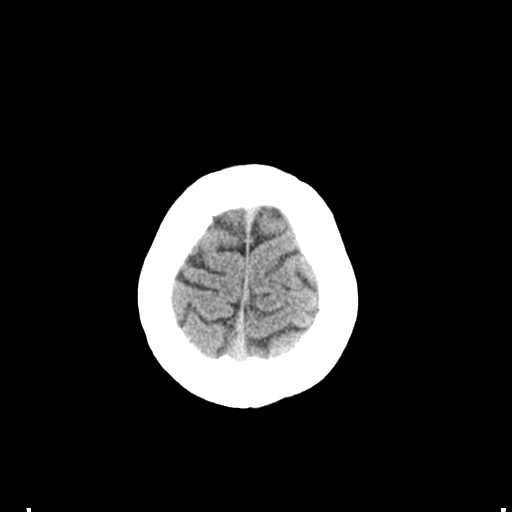
[im 24/29  bone]
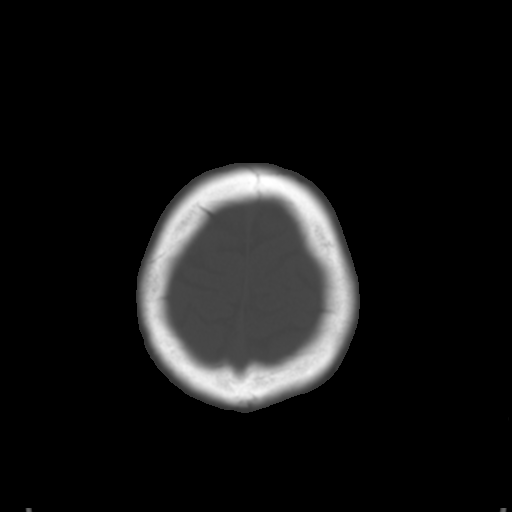
[im 27/29  brain]
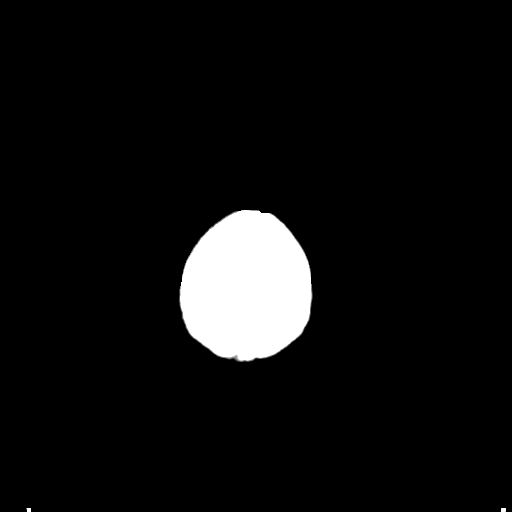

[Series 5: coronal soft tissue · coronal · 0.28mm/px · 3 of 61 slices shown]
[im 21/61  brain]
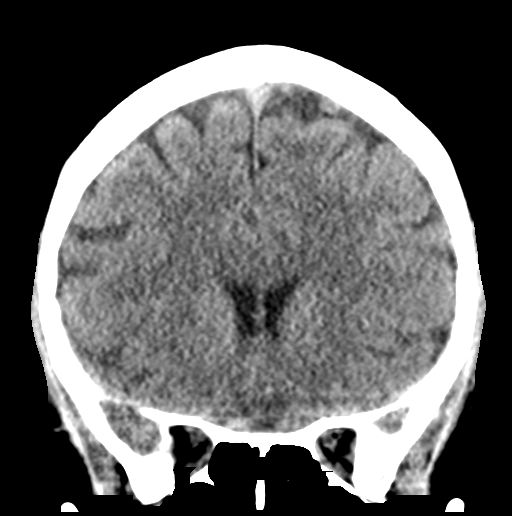
[im 27/61  brain]
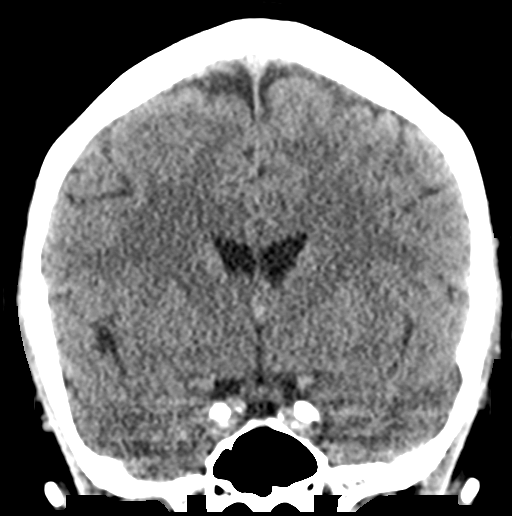
[im 34/61  brain]
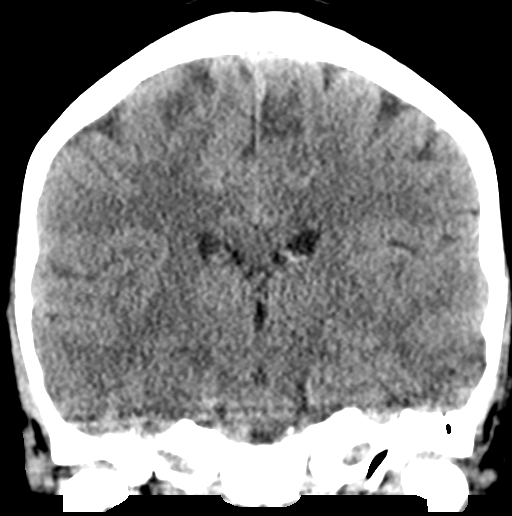

[Series 6: sagittal soft tissue · sagittal · 0.29mm/px · 3 of 50 slices shown]
[im 17/50  brain]
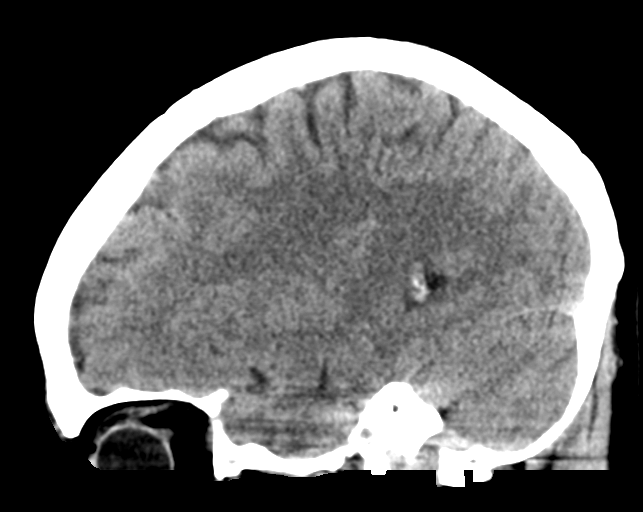
[im 25/50  brain]
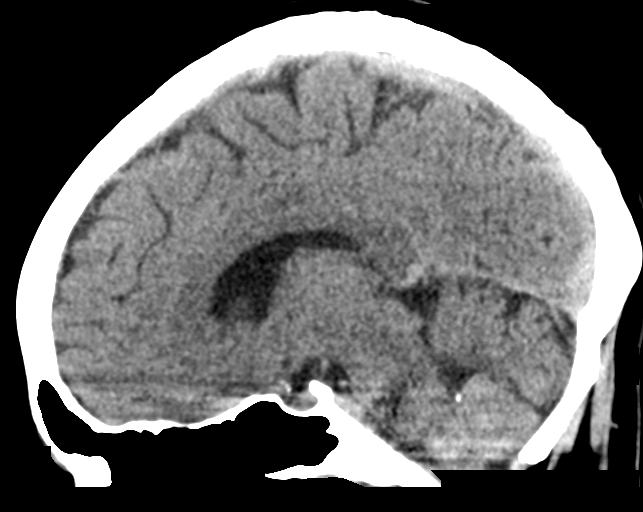
[im 33/50  brain]
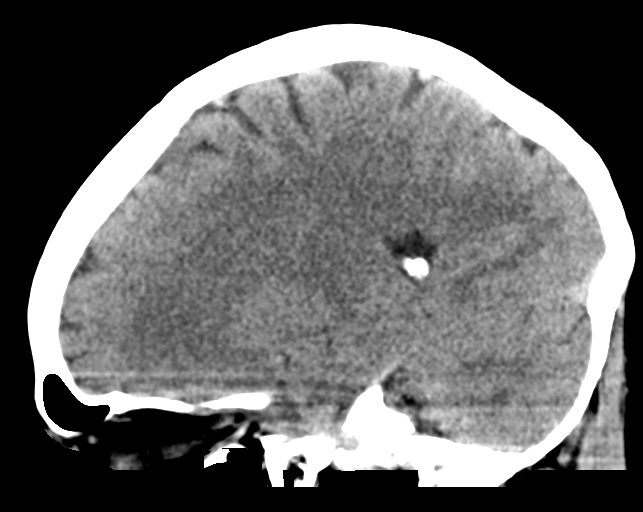

[16 of 46 positions shown; findings below may reference images not displayed]

FINDINGS: Brain: No evidence of acute infarction, hemorrhage, hydrocephalus,
extra-axial collection or mass lesion/mass effect. Small nodular
calcification in the fourth ventricle is stable from 2736.

Vascular: No hyperdense vessel or unexpected calcification.

Skull: Normal. Negative for fracture or focal lesion.

Sinuses/Orbits: Negative
IMPRESSION: No acute finding or change from priors.

## 2020-07-04 IMAGING — CR DG CHEST 2V
2 series · 2 of 2 positions shown · non-contrast
Comparison: None.

CLINICAL DATA: History of pulmonary nodules, some chest tightness
over the last 3 months as well as shortness of breath, smoking
history

EXAM:
CHEST - 2 VIEW

[w chest pa]
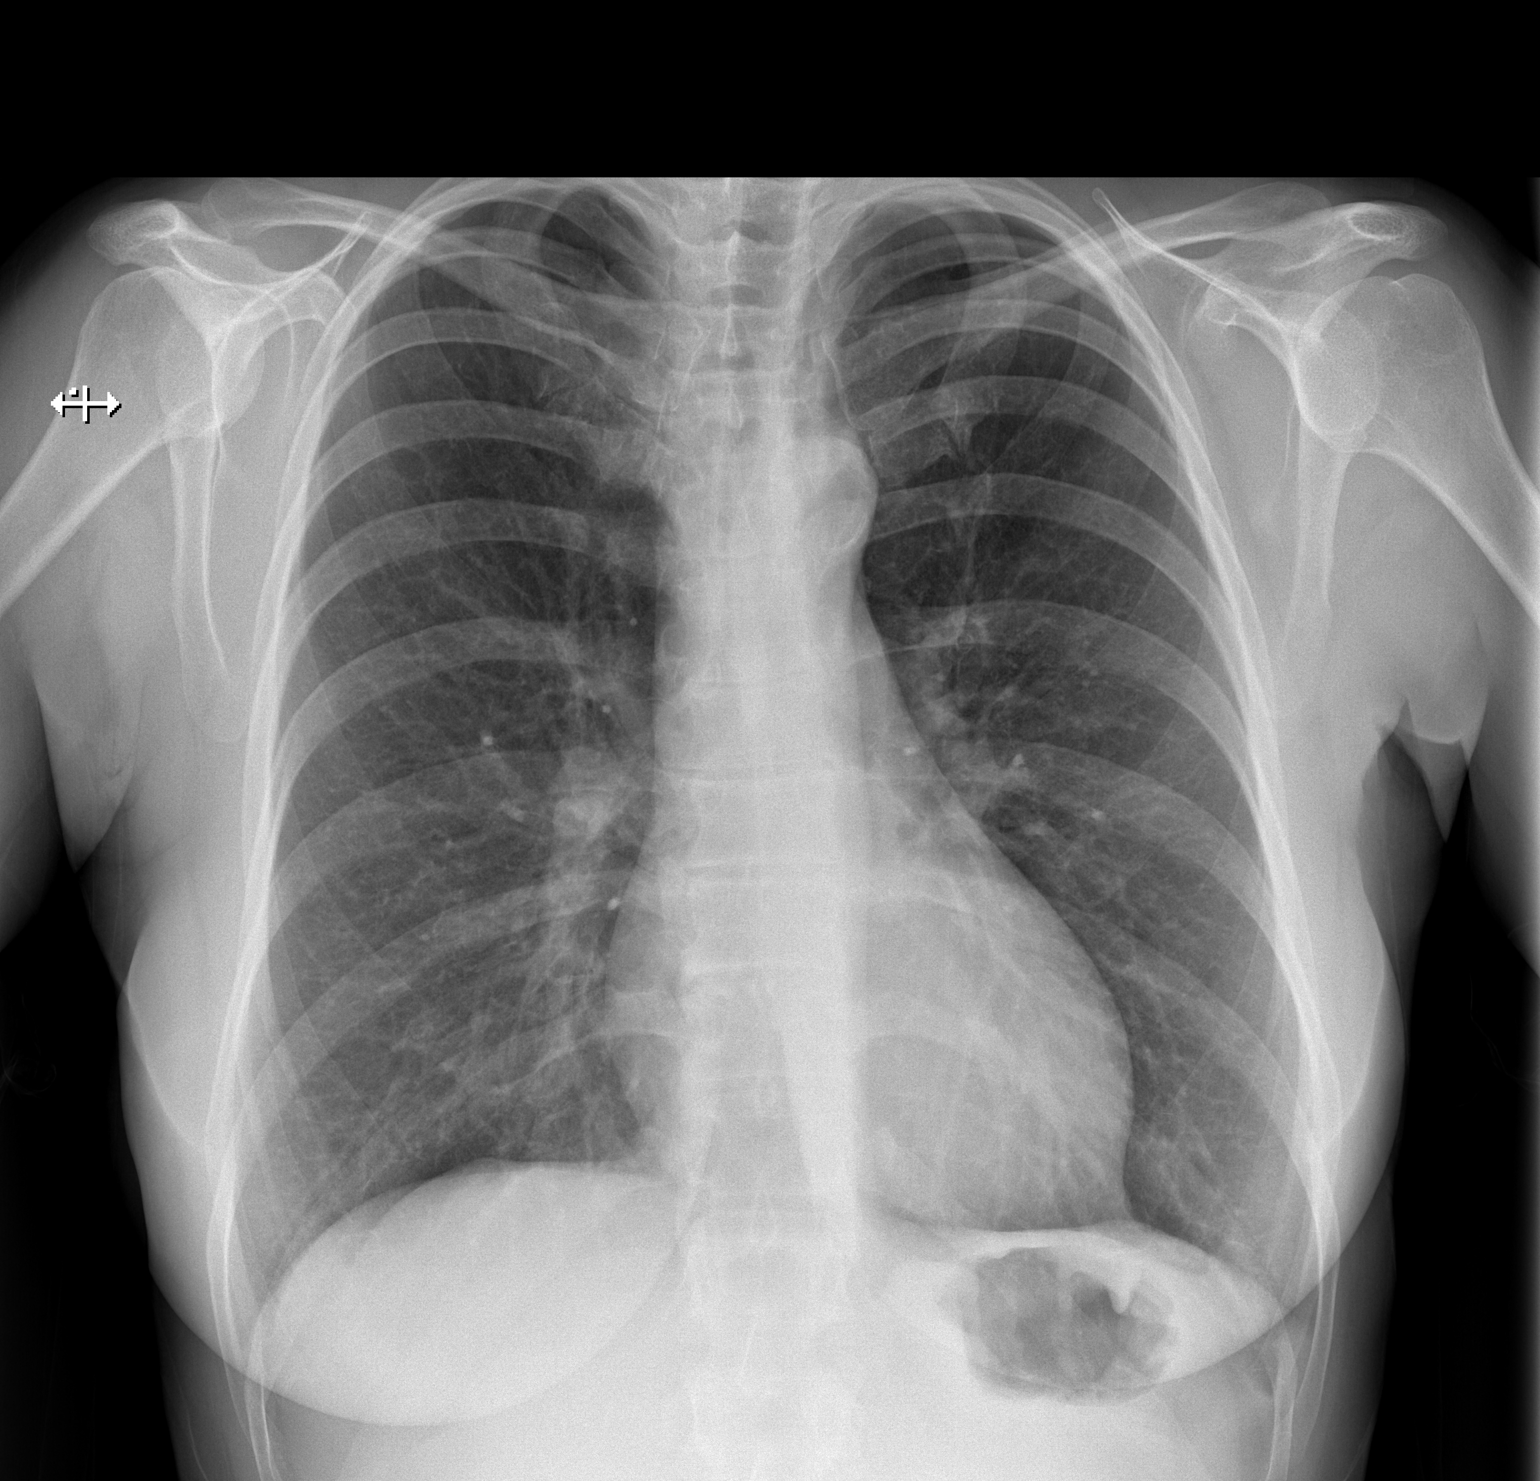

[w chest lat]
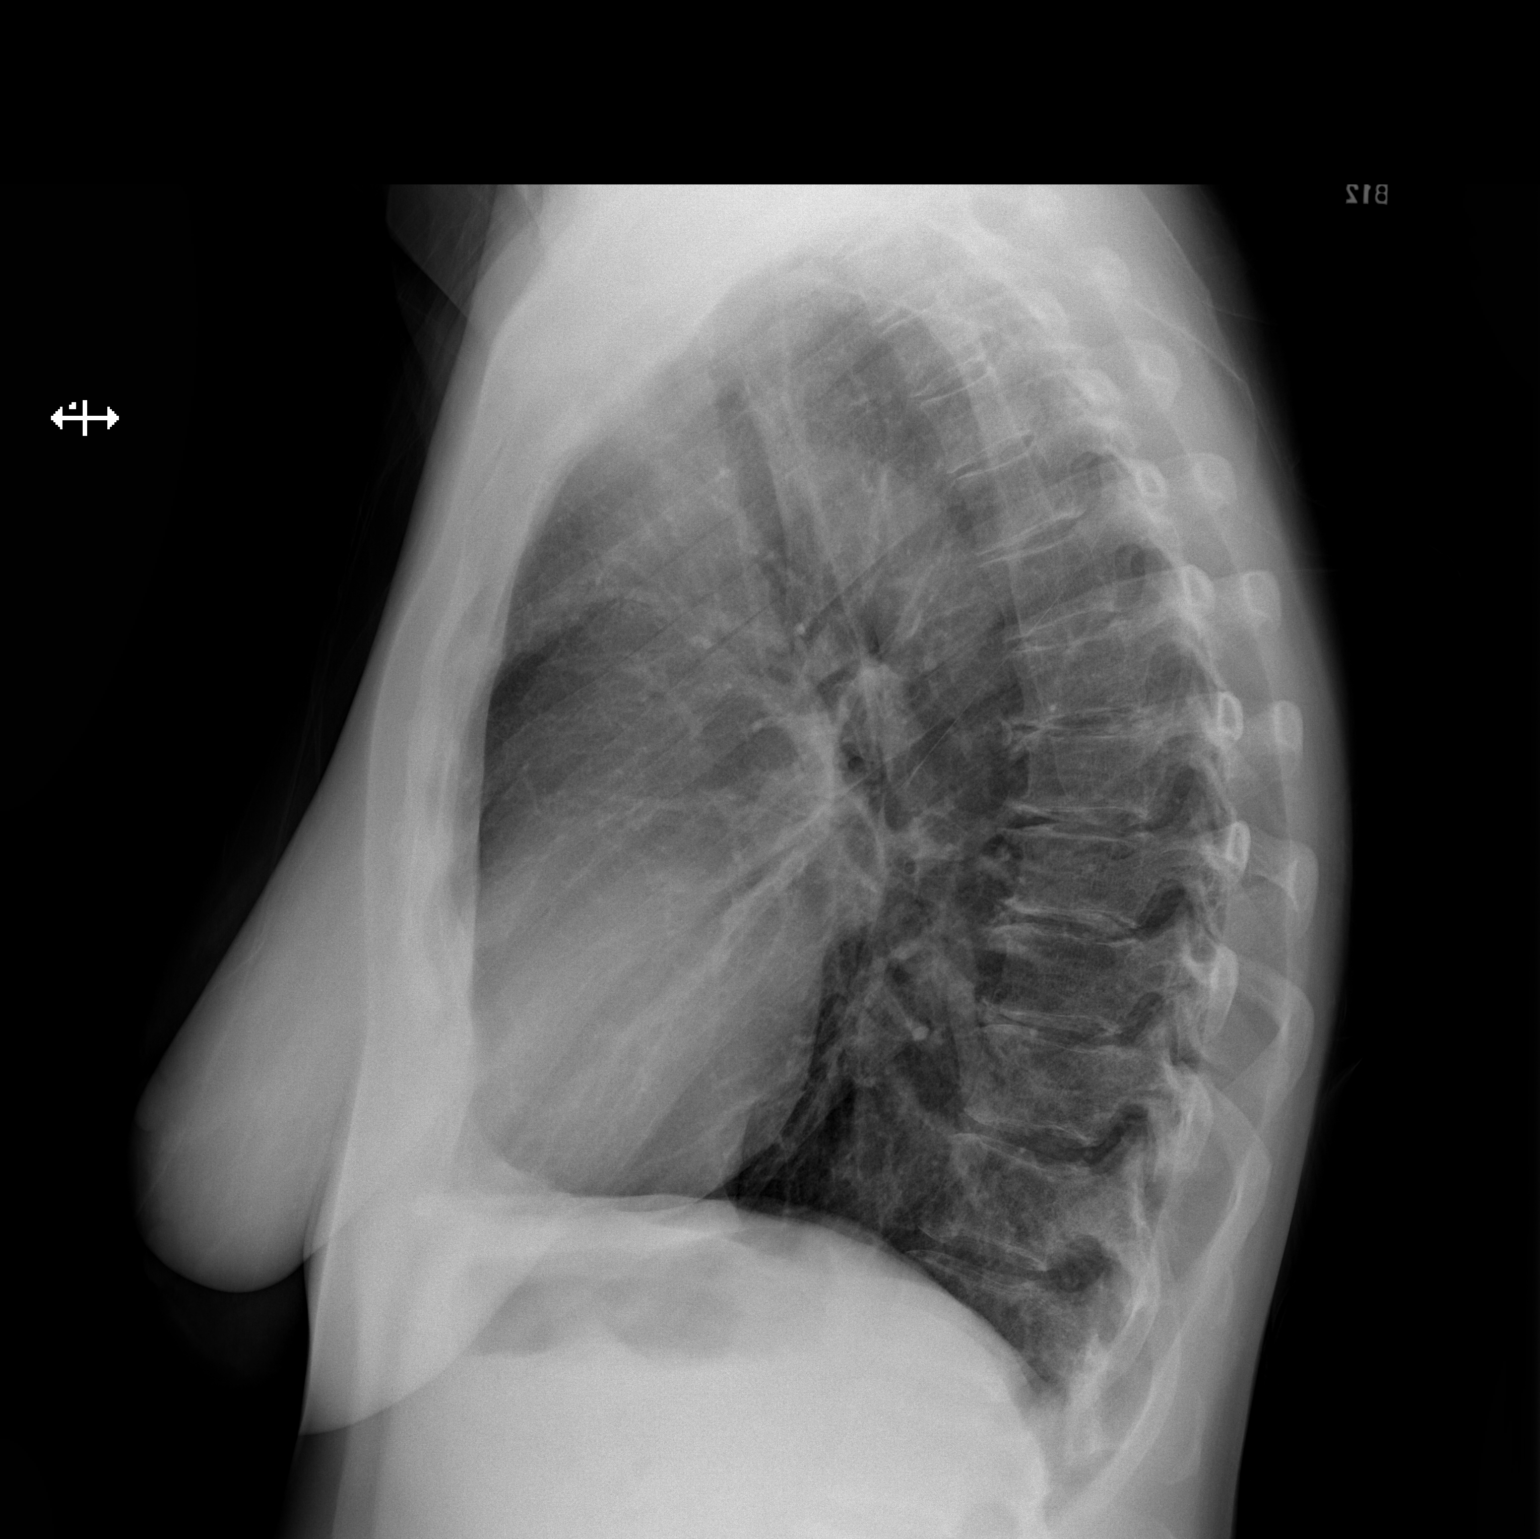

[2 of 2 positions shown; findings below may reference images not displayed]

FINDINGS: No active infiltrate or effusion is seen. The lungs are slightly
hyperaerated. Mediastinal and hilar contours are unremarkable. The
heart is within normal limits in size. Only mild degenerative
changes present in the mid to lower thoracic spine.
IMPRESSION: No active cardiopulmonary disease.  Mild hyper inflation.

## 2021-04-29 ENCOUNTER — Other Ambulatory Visit: Payer: Self-pay

## 2021-04-29 ENCOUNTER — Encounter (HOSPITAL_COMMUNITY): Payer: Self-pay

## 2021-04-29 ENCOUNTER — Emergency Department (HOSPITAL_COMMUNITY): Payer: Self-pay

## 2021-04-29 ENCOUNTER — Emergency Department (HOSPITAL_COMMUNITY)
Admission: EM | Admit: 2021-04-29 | Discharge: 2021-04-29 | Disposition: A | Discharge status: Home / Self Care | Payer: Self-pay | Attending: Emergency Medicine | Admitting: Emergency Medicine

## 2021-04-29 DIAGNOSIS — R002 Palpitations: Secondary | ICD-10-CM | POA: Insufficient documentation

## 2021-04-29 DIAGNOSIS — F17210 Nicotine dependence, cigarettes, uncomplicated: Secondary | ICD-10-CM | POA: Insufficient documentation

## 2021-04-29 DIAGNOSIS — R072 Precordial pain: Secondary | ICD-10-CM | POA: Insufficient documentation

## 2021-04-29 DIAGNOSIS — R079 Chest pain, unspecified: Secondary | ICD-10-CM

## 2021-04-29 LAB — TROPONIN I (HIGH SENSITIVITY)
Troponin I (High Sensitivity): 3 ng/L (ref <18)
Troponin I (High Sensitivity): 4 ng/L (ref <18)

## 2021-04-29 LAB — COMPREHENSIVE METABOLIC PANEL
ALT: 17 U/L (ref 0–44)
AST: 20 U/L (ref 15–41)
Albumin: 4.4 g/dL (ref 3.5–5.0)
Alkaline Phosphatase: 55 U/L (ref 38–126)
Anion gap: 8 (ref 5–15)
BUN: 17 mg/dL (ref 6–20)
CO2: 28 mmol/L (ref 22–32)
Calcium: 9.2 mg/dL (ref 8.9–10.3)
Chloride: 102 mmol/L (ref 98–111)
Creatinine, Ser: 0.72 mg/dL (ref 0.44–1.00)
GFR, Estimated: >60 mL/min (ref >60)
Glucose, Bld: 91 mg/dL (ref 70–99)
Potassium: 4 mmol/L (ref 3.5–5.1)
Sodium: 138 mmol/L (ref 135–145)
Total Bilirubin: 0.6 mg/dL (ref 0.3–1.2)
Total Protein: 7.4 g/dL (ref 6.5–8.1)

## 2021-04-29 LAB — CBC WITH DIFFERENTIAL/PLATELET
Abs Immature Granulocytes: 0.03 10*3/uL (ref 0.00–0.07)
Basophils Absolute: 0.1 10*3/uL (ref 0.0–0.1)
Basophils Relative: 1 %
Eosinophils Absolute: 0.5 10*3/uL (ref 0.0–0.5)
Eosinophils Relative: 5 %
HCT: 45.3 % (ref 36.0–46.0)
Hemoglobin: 15.1 g/dL — ABNORMAL HIGH (ref 12.0–15.0)
Immature Granulocytes: 0 %
Lymphocytes Relative: 29 %
Lymphs Abs: 2.8 10*3/uL (ref 0.7–4.0)
MCH: 31.9 pg (ref 26.0–34.0)
MCHC: 33.3 g/dL (ref 30.0–36.0)
MCV: 95.8 fL (ref 80.0–100.0)
Monocytes Absolute: 0.5 10*3/uL (ref 0.1–1.0)
Monocytes Relative: 5 %
Neutro Abs: 5.8 10*3/uL (ref 1.7–7.7)
Neutrophils Relative %: 60 %
Platelets: 274 10*3/uL (ref 150–400)
RBC: 4.73 MIL/uL (ref 3.87–5.11)
RDW: 13.1 % (ref 11.5–15.5)
WBC: 9.9 10*3/uL (ref 4.0–10.5)
nRBC: 0 % (ref 0.0–0.2)

## 2021-04-29 NOTE — ED Notes (Signed)
Save blue tube in main lab °

## 2021-04-29 NOTE — Discharge Instructions (Signed)
You were seen in the emergency department for evaluation of chest pain rating up into your throat.  You had blood work EKG and a chest x-ray that did not show an obvious explanation for your symptoms.  There was no evidence of a heart attack.  We contacted social work and they should be reaching out to you to help you get set up with a primary care doctor.

## 2021-04-29 NOTE — ED Provider Notes (Signed)
[facility]-EMERGENCY DEPT Provider Note   CSN: [id number] Arrival date & time: 04/29/21  2031     History Chief Complaint  Patient presents with   Chest Pain    Brittany Hull is a 50 y.o. female.  She is complaining of on and off chest pain that is been going on for about a year.  She says it feels like a vice grips grips are in the center of her chest and it radiates up into her throat.  Usually lasts a few minutes.  Happened again today.  Today associated with some numbness in her right arm.  She is a smoker, no history of cardiac problems.  She does not have a primary care doctor due to lack of insurance.  The history is provided by the patient.  Chest Pain Pain location:  Substernal area Pain quality: crushing and pressure   Pain radiates to:  Neck Pain severity:  Moderate Onset quality:  Sudden Duration:  15 minutes Timing:  Constant Progression:  Resolved Chronicity:  Recurrent Context: at rest   Relieved by:  None tried Worsened by:  Nothing Ineffective treatments:  None tried Associated symptoms: palpitations   Associated symptoms: no abdominal pain, no cough, no diaphoresis, no fever, no headache, no nausea, no shortness of breath and no vomiting   Risk factors: smoking       Past Medical History:  Diagnosis Date   Anemia    Bell's palsy    Seizures (HCC)     There are no problems to display for this patient.   Past Surgical History:  Procedure Laterality Date   TONSILLECTOMY     TUBAL LIGATION       OB History   No obstetric history on file.     History reviewed. No pertinent family history.  Social History   Tobacco Use   Smoking status: Every Day    Packs/day: 1.00    Types: Cigarettes   Smokeless tobacco: Never  Vaping Use   Vaping Use: Former  Substance Use Topics   Alcohol use: No   Drug use: No    Home Medications Prior to Admission medications   Medication Sig Start Date End Date Taking? Authorizing  Provider  ACAI BERRY PO Take 1 tablet by mouth 3 (three) times daily with meals.    [provider]  ibuprofen (ADVIL,MOTRIN) 200 MG tablet Take 400 mg by mouth every 6 (six) hours as needed for fever or headache.    [provider]  levETIRAcetam (KEPPRA) 500 MG tablet Take 1 tablet (500 mg total) by mouth 2 (two) times daily. Patient not taking: Reported on 05/30/2017 10/19/15   Horton, Mayer Masker, MD  penicillin v potassium (VEETID) 500 MG tablet Take 1 tablet (500 mg total) by mouth 3 (three) times daily. 06/28/18   Azalia Bilis, MD    Allergies    Patient has no known allergies.  Review of Systems   Review of Systems  Constitutional:  Negative for diaphoresis and fever.  HENT:  Negative for sore throat.   Eyes:  Negative for visual disturbance.  Respiratory:  Negative for cough and shortness of breath.   Cardiovascular:  Positive for chest pain and palpitations.  Gastrointestinal:  Negative for abdominal pain, nausea and vomiting.  Genitourinary:  Negative for dysuria.  Musculoskeletal:  Positive for neck pain.  Skin:  Negative for rash.  Neurological:  Negative for headaches.   Physical Exam Updated Vital Signs BP (!) 158/94 (BP Location: Left Arm)  Pulse (!) 56   Temp 98 F (36.7 C) (Oral)   Resp 15   SpO2 97%   Physical Exam Vitals and nursing note reviewed.  Constitutional:      General: She is not in acute distress.    Appearance: She is well-developed.  HENT:     Head: Normocephalic and atraumatic.  Eyes:     Conjunctiva/sclera: Conjunctivae normal.  Cardiovascular:     Rate and Rhythm: Normal rate and regular rhythm.     Heart sounds: Normal heart sounds. No murmur heard. Pulmonary:     Effort: Pulmonary effort is normal. No respiratory distress.     Breath sounds: Normal breath sounds.  Abdominal:     Palpations: Abdomen is soft.     Tenderness: There is no abdominal tenderness.  Musculoskeletal:        General: Normal range of motion.      Cervical back: Neck supple.     Right lower leg: No tenderness. No edema.     Left lower leg: No tenderness. No edema.  Skin:    General: Skin is warm and dry.  Neurological:     General: No focal deficit present.     Mental Status: She is alert.    ED Results / Procedures / Treatments   Labs (all labs ordered are listed, but only abnormal results are displayed) Labs Reviewed  CBC WITH DIFFERENTIAL/PLATELET - Abnormal; Notable for the following components:      Result Value   Hemoglobin 15.1 (*)    All other components within normal limits  COMPREHENSIVE METABOLIC PANEL  TROPONIN I (HIGH SENSITIVITY)  TROPONIN I (HIGH SENSITIVITY)    EKG EKG Interpretation  Date/Time:  Tuesday April 29 2021 20:37:50 EDT Ventricular Rate:  54 PR Interval:  136 QRS Duration: 103 QT Interval:  430 QTC Calculation: 408 R Axis:   82 Text Interpretation: Sinus rhythm No significant change since prior 2/17 Confirmed by Meridee Score [phone] on 04/29/2021 9:42:44 PM  Radiology DG Chest Portable 1 View  Result Date: 04/29/2021 CLINICAL DATA:  Chest pain EXAM: PORTABLE CHEST 1 VIEW COMPARISON:  06/28/2018 FINDINGS: The heart size and mediastinal contours are within normal limits. Both lungs are clear. The visualized skeletal structures are unremarkable. IMPRESSION: No active disease. Electronically Signed   By: Helyn Numbers M.D.   On: 04/29/2021 21:06    Procedures Procedures   Medications Ordered in ED Medications - No data to display  ED Course  I have reviewed the triage vital signs and the nursing notes.  Pertinent labs & imaging results that were available during my care of the patient were reviewed by me and considered in my medical decision making (see chart for details).    MDM Rules/Calculators/A&P                          This patient complains of chest pain and throat tightness; this involves an extensive number of treatment Options and is a complaint that carries  with it a high risk of complications and Morbidity. The differential includes ACS, pneumonia, PE, vascular, GERD, musculoskeletal pain anxiety, vascular  I ordered, reviewed and interpreted labs, which included CBC with normal white count normal hemoglobin, chemistries LFTs normal, troponins flat I ordered imaging studies which included chest x-ray and I independently    visualized and interpreted imaging which showed no acute findings  Previous records obtained and reviewed in epic no recent admissions  After the interventions stated  above, I reevaluated the patient and found patient to be hemodynamically stable no active chest pain.  Reviewed work-up with her.  Recommended follow-up with primary care.  Have placed consult to social work to see if we can get her a primary care doctor.  Return instructions discussed   Final Clinical Impression(s) / ED Diagnoses Final diagnoses:  Nonspecific chest pain    Rx / DC Orders ED Discharge Orders     None        Terrilee Files, MD 04/30/21 1017

## 2021-04-29 NOTE — ED Triage Notes (Signed)
Pt complains of chest pain x 1 year. Pt states that today she had a tight pain in her chest that went up to her throat.

## 2021-04-29 NOTE — ED Provider Notes (Signed)
Emergency Medicine Provider Triage Evaluation Note  Brittany Hull , a 50 y.o. female  was evaluated in triage.  Pt complains of intermittent chest pain.  Patient reports intermittent episodes of chest pain for the past year.  Patient states earlier today she had a 10 to 15-minute episode of central chest pressure.  Physical Exam  BP (!) 158/94 (BP Location: Left Arm)   Pulse (!) 56   Temp 98 F (36.7 C) (Oral)   Resp 15   SpO2 97%  Gen:   Awake, no distress   Resp:  Normal effort  MSK:   Moves extremities without difficulty  Other:    Medical Decision Making  Medically screening exam initiated at 8:45 PM.  Appropriate orders placed.  Brittany Hull was informed that the remainder of the evaluation will be completed by another provider, this initial triage assessment does not replace that evaluation, and the importance of remaining in the ED until their evaluation is complete.   Placido Sou, PA-C 04/29/21 2046    Terrilee Files, MD 04/30/21 1017

## 2023-05-06 IMAGING — DX DG CHEST 1V PORT
1 series · 1 of 1 positions shown · non-contrast
Comparison: 06/28/2018

CLINICAL DATA: Chest pain

EXAM:
PORTABLE CHEST 1 VIEW

[chest ap]
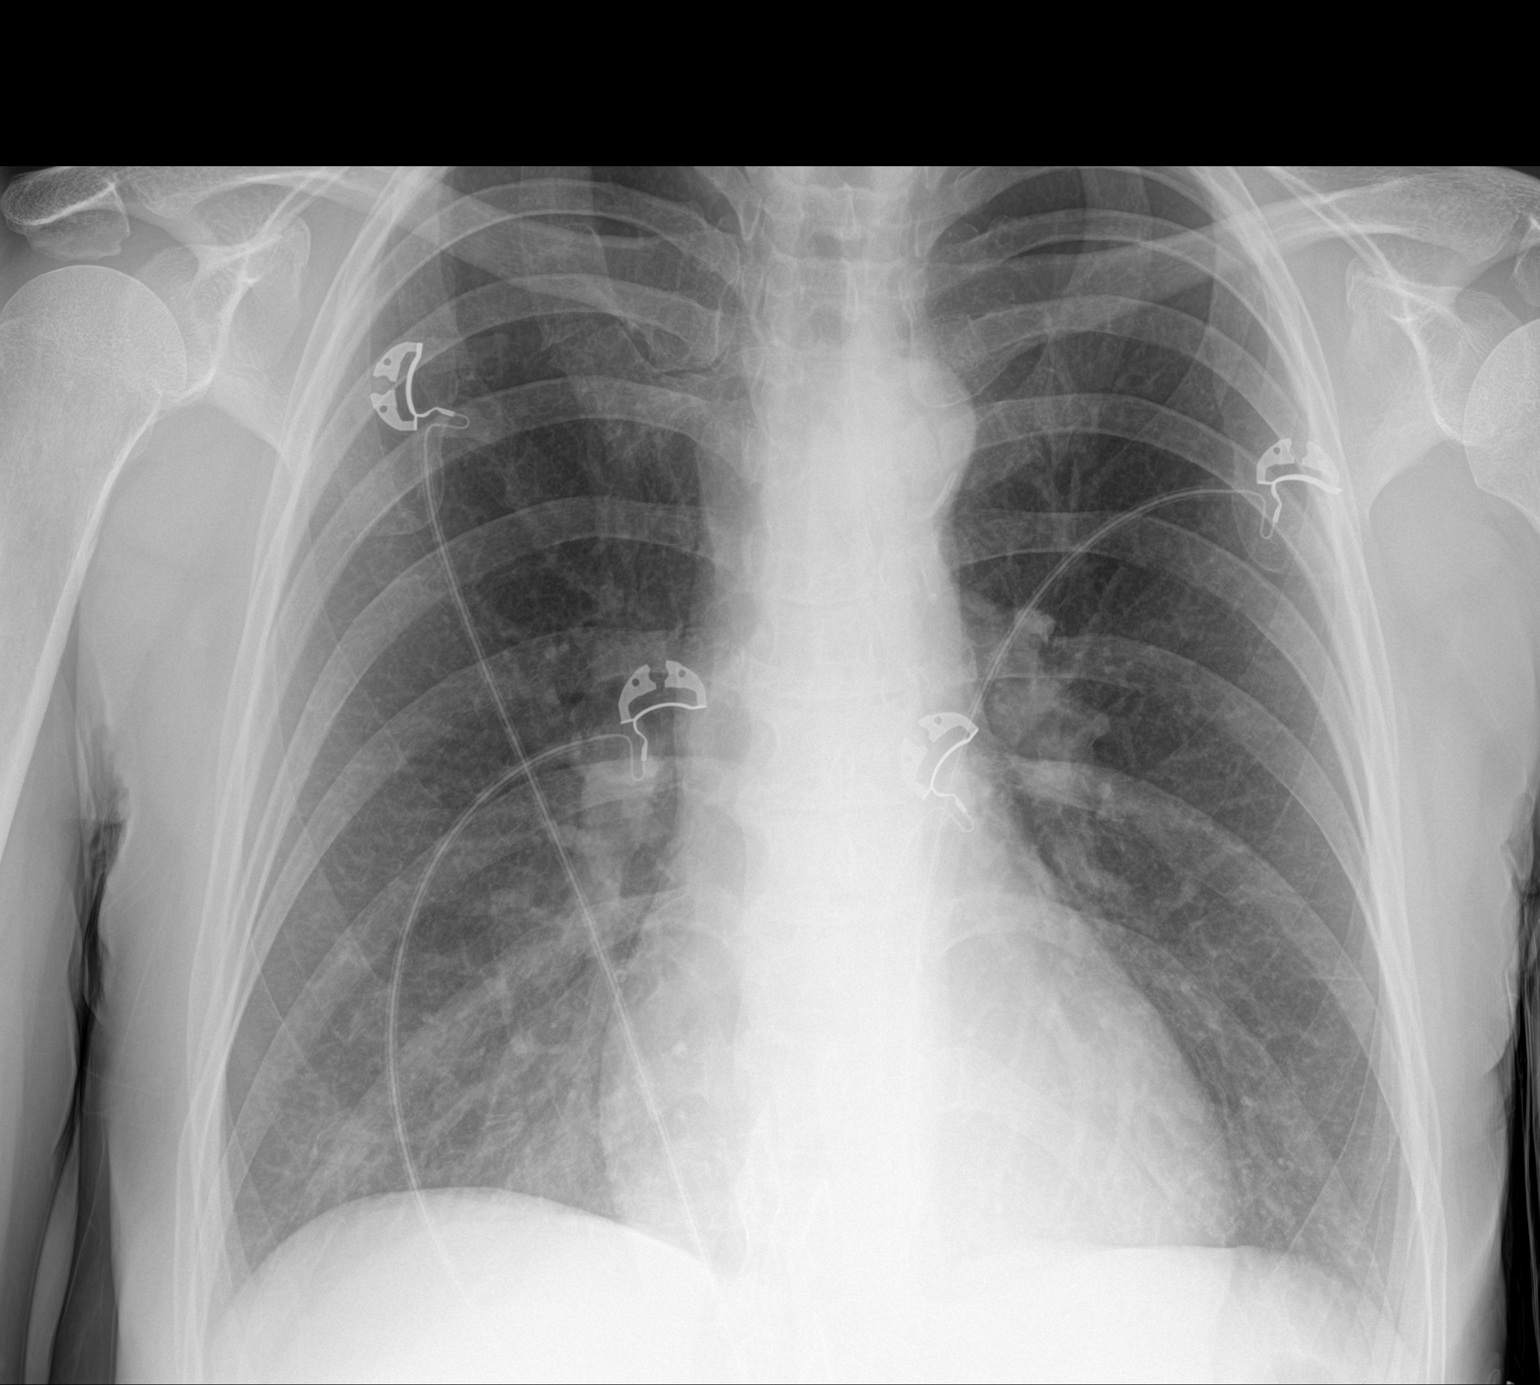

[1 of 1 positions shown; findings below may reference images not displayed]

FINDINGS: The heart size and mediastinal contours are within normal limits.
Both lungs are clear. The visualized skeletal structures are
unremarkable.
IMPRESSION: No active disease.

## 2023-06-23 ENCOUNTER — Other Ambulatory Visit: Payer: Self-pay

## 2023-06-23 ENCOUNTER — Emergency Department (HOSPITAL_COMMUNITY): Payer: Commercial Managed Care - HMO

## 2023-06-23 ENCOUNTER — Encounter (HOSPITAL_COMMUNITY): Payer: Self-pay | Admitting: Emergency Medicine

## 2023-06-23 ENCOUNTER — Emergency Department (HOSPITAL_COMMUNITY)
Admission: EM | Admit: 2023-06-23 | Discharge: 2023-06-23 | Disposition: A | Discharge status: Home / Self Care | Payer: Commercial Managed Care - HMO | Attending: Emergency Medicine | Admitting: Emergency Medicine

## 2023-06-23 DIAGNOSIS — R079 Chest pain, unspecified: Secondary | ICD-10-CM | POA: Diagnosis not present

## 2023-06-23 DIAGNOSIS — R519 Headache, unspecified: Secondary | ICD-10-CM | POA: Diagnosis present

## 2023-06-23 LAB — COMPREHENSIVE METABOLIC PANEL
ALT: 19 U/L (ref 0–44)
AST: 19 U/L (ref 15–41)
Albumin: 4.2 g/dL (ref 3.5–5.0)
Alkaline Phosphatase: 54 U/L (ref 38–126)
Anion gap: 7 (ref 5–15)
BUN: 17 mg/dL (ref 6–20)
CO2: 30 mmol/L (ref 22–32)
Calcium: 9.7 mg/dL (ref 8.9–10.3)
Chloride: 104 mmol/L (ref 98–111)
Creatinine, Ser: 0.7 mg/dL (ref 0.44–1.00)
GFR, Estimated: >60 mL/min (ref >60)
Glucose, Bld: 68 mg/dL — ABNORMAL LOW (ref 70–99)
Potassium: 4.2 mmol/L (ref 3.5–5.1)
Sodium: 141 mmol/L (ref 135–145)
Total Bilirubin: 0.7 mg/dL (ref 0.3–1.2)
Total Protein: 7.4 g/dL (ref 6.5–8.1)

## 2023-06-23 LAB — CBC
HCT: 43.5 % (ref 36.0–46.0)
Hemoglobin: 14.5 g/dL (ref 12.0–15.0)
MCH: 31 pg (ref 26.0–34.0)
MCHC: 33.3 g/dL (ref 30.0–36.0)
MCV: 93.1 fL (ref 80.0–100.0)
Platelets: 231 10*3/uL (ref 150–400)
RBC: 4.67 MIL/uL (ref 3.87–5.11)
RDW: 13 % (ref 11.5–15.5)
WBC: 6.9 10*3/uL (ref 4.0–10.5)
nRBC: 0 % (ref 0.0–0.2)

## 2023-06-23 LAB — TROPONIN I (HIGH SENSITIVITY): Troponin I (High Sensitivity): 4 ng/L (ref <18)

## 2023-06-23 MED ORDER — KETOROLAC TROMETHAMINE 15 MG/ML IJ SOLN: 15.0000 mg | Freq: Once | INTRAMUSCULAR | Status: DC

## 2023-06-23 MED ORDER — DIPHENHYDRAMINE HCL 50 MG/ML IJ SOLN
12.5000 mg | Freq: Once | INTRAMUSCULAR | Status: AC
  Administered 2023-06-23: 12.5 mg via INTRAVENOUS
  Filled 2023-06-23: qty 1

## 2023-06-23 MED ORDER — METOCLOPRAMIDE HCL 5 MG/ML IJ SOLN
10.0000 mg | Freq: Once | INTRAMUSCULAR | Status: AC
  Administered 2023-06-23: 10 mg via INTRAVENOUS
  Filled 2023-06-23: qty 2

## 2023-06-23 NOTE — ED Triage Notes (Signed)
Patient arrives ambulatory by POV c/o intermittent chest pain for over a year, issues with swallowing x 5 months and headache onset of yesterday morning. Reports being around other with strep and cold like symptoms.

## 2023-06-23 NOTE — ED Provider Triage Note (Addendum)
Emergency Medicine Provider Triage Evaluation Note  Brittany Hull , a 51 y.o. female  was evaluated in triage.  Pt complains of headache and chest pain.  Review of Systems  Positive:  Negative:   Physical Exam  BP (!) 143/84 (BP Location: Left Arm)   Pulse (!) 59   Temp 98.1 F (36.7 C) (Oral)   Resp 13   Ht 5\' 10"  (1.778 m)   Wt 70.8 kg   SpO2 98%   BMI 22.38 kg/m  Gen:   Awake, no distress   Resp:  Normal effort  MSK:   Moves extremities without difficulty  Other:    Medical Decision Making  Medically screening exam initiated at 12:30 PM.  Appropriate orders placed.  Brittany Hull was informed that the remainder of the evaluation will be completed by another provider, this initial triage assessment does not replace that evaluation, and the importance of remaining in the ED until their evaluation is complete.  Frontal headache x2 days. Described as dull ache. States that it causes her to "stare off" into space while driving but ensuring me that she can still see fine while driving. Took Excedrin, Tylenol, and Advil without relief. Denies head trauma, LOC, seizure, blood thinners. States that she gets at least 4 headaches per week.   Also with intermittent central chest pain over the past year that causes her to "space out". Last episode of chest pain was 4 days ago. Patient stating that it feels like a panic attack. Denies recent surgery/immobilization, hx DT/PE, hemoptysis, hx cancer in the past 6 months, calf swelling/tenderness. Denies hx of acid reflux. Denies fever, dyspnea, cough, nausea, vomiting, diarrhea, dysuria, hematuria, hematochezia.   GCS 15. Speech is goal oriented. No deficits appreciated to CN III-XII; symmetric eyebrow raise, no facial drooping, tongue midline. Patient has equal grip strength bilaterally with 5/5 strength against resistance in all major muscle groups bilaterally. Sensation to light touch intact. Patient moves extremities without ataxia. Patient  ambulatory with steady gait.       [name], [place] 06/23/23 1238

## 2023-06-23 NOTE — ED Provider Notes (Signed)
[facility] EMERGENCY DEPARTMENT AT [facility] Provider Note   CSN: [id number] Arrival date & time: 06/23/23  1217     History  Chief Complaint  Patient presents with   Headache   Chest Pain    Brittany Hull is a 52 y.o. female with past medical history of Bell's palsy and anemia presenting to the emergency room with headache.  Patient reports that her headaches ongoing for 2 days.  Patient reports she has headaches 4 times a week.  Patient reports this feels like typical headaches however lasting longer than normal and more severe.  Patient reports headache wraps around her head.  Denies any recent falls injuries or trauma. Patient also reports that over the past year she has had intermittent chest pain which feels like a burning pain in her chest that makes it feel like she has something in her throat.  Patient also reports that she has occasional referred pain to left shoulder.  Patient reports she has not had this discomfort in 2 days.  Patient reports that she has not had this chest pain for 2 days.  Denies active chest pain, shortness of breath, cough, nausea vomiting or abdominal pain.  Denies any focal weakness, blurry vision, change in vision.   Headache Chest Pain Associated symptoms: headache        Home Medications Prior to Admission medications   Medication Sig Start Date End Date Taking? Authorizing Provider  ACAI BERRY PO Take 1 tablet by mouth 3 (three) times daily with meals.    [provider]  ibuprofen (ADVIL,MOTRIN) 200 MG tablet Take 400 mg by mouth every 6 (six) hours as needed for fever or headache.    [provider]  levETIRAcetam (KEPPRA) 500 MG tablet Take 1 tablet (500 mg total) by mouth 2 (two) times daily. Patient not taking: Reported on 05/30/2017 10/19/15   Horton, Mayer Masker, MD  penicillin v potassium (VEETID) 500 MG tablet Take 1 tablet (500 mg total) by mouth 3 (three) times daily. 06/28/18   Azalia Bilis, MD       Allergies    Patient has no known allergies.    Review of Systems   Review of Systems  Cardiovascular:  Negative for chest pain.  Neurological:  Positive for headaches.    Physical Exam Updated Vital Signs BP (!) 143/84 (BP Location: Left Arm)   Pulse (!) 59   Temp 98.1 F (36.7 C) (Oral)   Resp 13   Ht 5\' 10"  (1.778 m)   Wt 70.8 kg   SpO2 98%   BMI 22.38 kg/m  Physical Exam Vitals and nursing note reviewed.  Constitutional:      General: She is not in acute distress.    Appearance: She is not toxic-appearing.  HENT:     Head: Normocephalic and atraumatic.  Eyes:     General: No scleral icterus.    Conjunctiva/sclera: Conjunctivae normal.  Cardiovascular:     Rate and Rhythm: Normal rate and regular rhythm.     Pulses: Normal pulses.     Heart sounds: Normal heart sounds.  Pulmonary:     Effort: Pulmonary effort is normal. No respiratory distress.     Breath sounds: Normal breath sounds.  Abdominal:     General: Abdomen is flat. Bowel sounds are normal.     Palpations: Abdomen is soft.     Tenderness: There is no abdominal tenderness.  Skin:    General: Skin is warm and dry.  Findings: No lesion.  Neurological:     General: No focal deficit present.     Mental Status: She is alert and oriented to person, place, and time. Mental status is at baseline.     Cranial Nerves: No cranial nerve deficit.     Sensory: No sensory deficit.     Motor: No weakness.     Coordination: Coordination normal.     Gait: Gait normal.     Comments: Patient has no ptosis or cranial nerve deficit.  Has equal reactive pupils.  No nystagmus.  Patient able to hold himself against resistance.  No grip weakness.  No sensation changes.  Strong radial pulse.  Ambulating with steady gait no ataxia.  No sensory changes in lower extremity.     ED Results / Procedures / Treatments   Labs (all labs ordered are listed, but only abnormal results are displayed) Labs Reviewed   COMPREHENSIVE METABOLIC PANEL - Abnormal; Notable for the following components:      Result Value   Glucose, Bld 68 (*)    All other components within normal limits  CBC  TROPONIN I (HIGH SENSITIVITY)  TROPONIN I (HIGH SENSITIVITY)    EKG EKG Interpretation Date/Time:  Wednesday June 23 2023 12:25:55 EDT Ventricular Rate:  56 PR Interval:  142 QRS Duration:  100 QT Interval:  419 QTC Calculation: 405 R Axis:   73  Text Interpretation: Sinus rhythm No significant change since last tracing Confirmed by Jacalyn Lefevre [phone]) on 06/23/2023 4:17:13 PM  Radiology DG Chest 2 View  Result Date: 06/23/2023 CLINICAL DATA:  Difficulty swallowing for 5 months. EXAM: CHEST - 2 VIEW COMPARISON:  Chest x-ray one view 04/29/2021.  Older exams as well. FINDINGS: Hyperinflation. No consolidation, pneumothorax or effusion. No edema. Normal cardiopericardial silhouette. Degenerative changes of the spine. IMPRESSION: Hyperinflation.  No acute cardiopulmonary disease. Electronically Signed   By: Karen Kays M.D.   On: 06/23/2023 15:56    Procedures Procedures    Medications Ordered in ED Medications - No data to display  ED Course/ Medical Decision Making/ A&P                                 Medical Decision Making Amount and/or Complexity of Data Reviewed Radiology: ordered.  Risk Prescription drug management.   Brittany Hull 52 y.o. presented today for HA. Working Ddx: tension headache, migraine, intracranial mass, intracranial hemorrhage, intracranial infection including meningitis vs encephalitis, trigeminal neuralgia, AVM, sinusitis, cerebral aneurysm, muscular headache, cavernous sinus thrombosis, carotid artery dissection.  R/o DDx: intracranial mass, hemorrhage,or infection including meningitis vs encephalitis, trigeminal neuralgia, AVM, cerebral aneurysm, muscular headache, cavernous sinus thrombosis, carotid artery dissection are less likely due to history of present  illness, physical exam, labs/imaging findings  PMHX: anemia  Review of prior external notes: none  Unique Tests and My Interpretation:  CBC: No leukocytosis no anemia CMP: Unremarkable Troponin: 4   CT Head w/o Contrast: No intracranial pathology Chest x-ray shows no active pathology  Problem List / ED Course / Critical interventions / Medication management  Patient reporting this week with negative headache.  Patient reports she has recurrent headaches for approximately 4 times a week.  Patient reports that this is more severe and lasted longer than her normal headaches.  Given presentation CTA of the head obtained.  CT shows no intracranial pathology.  Patient is not having any active chest pain thus repeat troponin was not obtained.  Patient's headache was treated with Reglan and Benadryl and improved.  Patient requesting to go home.  Given reassuring exam, lab and imaging,  Patient appears well and hemodynamically stable will discharge home for primary care follow-up. I ordered medication including Reglan and Benadryl for headache  Reevaluation of the patient after these medicines showed that the patient improved Patients vitals assessed. Upon arrival patient is hemodynamically stable.  I have reviewed the patients home medicines and have made adjustments as needed    Plan:  Patient does not currently have primary care.  Encourage patient to find a primary care and pt agrees Patient was given return precautions. Patient stable for discharge at this time.  Patient educated on sx/dx and verbalized understanding of plan. Return to ED if new or worsening sx.           Final Clinical Impression(s) / ED Diagnoses Final diagnoses:  None    Rx / DC Orders ED Discharge Orders     None         Raford Pitcher Evalee Jefferson 06/23/23 2024    Jacalyn Lefevre, MD 06/23/23 2031

## 2023-06-23 NOTE — Discharge Instructions (Signed)
You were seen in the emergency room today for headache.  Your imaging and lab work came back reassuring.  Please find primary care to schedule follow-up for today's visit.  You can call the number attached to your discharge paperwork to find primary care.  You can also call [facility].  Please return to emergency room with any new or worsening symptoms.

## 2023-11-24 ENCOUNTER — Other Ambulatory Visit: Payer: Self-pay

## 2023-11-24 DIAGNOSIS — N6320 Unspecified lump in the left breast, unspecified quadrant: Secondary | ICD-10-CM

## 2023-11-30 ENCOUNTER — Ambulatory Visit: Payer: Self-pay

## 2023-12-02 ENCOUNTER — Ambulatory Visit: Payer: Self-pay | Admitting: Hematology and Oncology

## 2023-12-02 VITALS — Wt 155.7 lb

## 2023-12-02 DIAGNOSIS — Z124 Encounter for screening for malignant neoplasm of cervix: Secondary | ICD-10-CM

## 2023-12-02 DIAGNOSIS — Z1211 Encounter for screening for malignant neoplasm of colon: Secondary | ICD-10-CM

## 2023-12-02 DIAGNOSIS — Z122 Encounter for screening for malignant neoplasm of respiratory organs: Secondary | ICD-10-CM

## 2023-12-02 NOTE — Patient Instructions (Signed)
 Taught Brittany Hull about self breast awareness and gave educational materials to take home. Patient did need a Pap smear today due to last Pap smear was 17 years ago per patient. Told patient about free cervical cancer screenings to receive a Pap smear if would like one next year. Let her know [facility] will cover Pap smears every 5 years unless has a history of abnormal Pap smears. Referred patient to the [facility] for diagnostic mammogram. Appointment scheduled for 01/06/2024. Patient aware of appointment and will be there. Let patient know will follow up with her within the next couple weeks with results. Brittany Hull verbalized understanding.  Pascal Lux, NP 1:08 PM

## 2023-12-02 NOTE — Progress Notes (Signed)
 [name] is a 53 y.o. No obstetric history on file. female who presents to [facility] clinic today with complaint of left breast nodule with redness.    Pap Smear: Pap smear completed today. Last Pap smear was 17 years ago was  unknown . Per patient has history of an abnormal Pap smear. Last Pap smear result is not available in Epic. Patient reports history of abnormal Pap smears with cryotherapy. Unsure of dates.    Physical exam: Breasts Breasts symmetrical. No skin abnormalities bilateral breasts. No nipple retraction bilateral breasts. No nipple discharge bilateral breasts. No lymphadenopathy. No lumps palpated bilateral breasts.       Pelvic/Bimanual Ext Genitalia No lesions, no swelling and no discharge observed on external genitalia.        Vagina Vagina pink and normal texture. No lesions or discharge observed in vagina.        Cervix Cervix is present. Cervix pink and of normal texture. No discharge observed.    Uterus Uterus is present and palpable. Uterus in normal position and normal size.        Adnexae Bilateral ovaries present and palpable. No tenderness on palpation.         Rectovaginal No rectal exam completed today since patient had no rectal complaints. No skin abnormalities observed on exam.     Smoking History: Patient has is a current smoker at 1/2 packs per day and was not referred to quit line.    Patient Navigation: Patient education provided. Access to services provided for patient through [facility] program. No interpreter provided. No transportation provided   Colorectal Cancer Screening: Per patient has never had colonoscopy completed No complaints today. FIT test given.    Breast and Cervical Cancer Risk Assessment: Patient has family history of breast cancer, with paternal grandmother, paternal aunt x 3, and maternal cousin. Patient has history of cervical dysplasia, immunocompromised, or DES exposure in-utero.  Risk Scores as of Encounter on  12/02/2023     Brittany Hull           5-year 1.22%   Lifetime 9.42%            Last calculated by Meryl Dare, CMA on 12/02/2023 at 12:41 PM        A: [facility] exam with pap smear Complaint of left breast nodule with redness that has improved. Benign exam. Will continue with diagnostic imaging in May.  P: Referred patient to the [facility] for a diagnostic mammogram. Appointment scheduled 01/06/24.  Ilda Basset A, NP 12/02/2023 1:04 PM

## 2023-12-08 LAB — CYTOLOGY - PAP
Comment: NEGATIVE
Diagnosis: NEGATIVE
High risk HPV: NEGATIVE

## 2023-12-13 NOTE — Telephone Encounter (Signed)
 Brittany Hull your pap smear is normal and the HPV is negative. Your next pap smear will be due in 5 years. If you have any questions please don't hesitate to call the number on your pink card. Take care!

## 2023-12-30 ENCOUNTER — Encounter (HOSPITAL_COMMUNITY): Payer: Self-pay

## 2024-01-06 ENCOUNTER — Ambulatory Visit
Admission: RE | Admit: 2024-01-06 | Discharge: 2024-01-06 | Disposition: A | Discharge status: Home / Self Care | Source: Ambulatory Visit | Attending: Obstetrics and Gynecology | Admitting: Obstetrics and Gynecology

## 2024-01-06 DIAGNOSIS — N6320 Unspecified lump in the left breast, unspecified quadrant: Secondary | ICD-10-CM

## 2024-01-10 ENCOUNTER — Ambulatory Visit: Payer: Self-pay | Admitting: Obstetrics & Gynecology

## 2024-02-02 ENCOUNTER — Ambulatory Visit: Payer: Self-pay | Admitting: Pulmonary Disease

## 2024-02-08 ENCOUNTER — Encounter: Payer: Self-pay | Admitting: Emergency Medicine

## 2024-06-16 ENCOUNTER — Other Ambulatory Visit: Payer: Self-pay | Admitting: Medical Genetics

## 2024-09-05 ENCOUNTER — Other Ambulatory Visit (HOSPITAL_COMMUNITY)
Admission: RE | Admit: 2024-09-05 | Discharge: 2024-09-05 | Disposition: A | Discharge status: Home / Self Care | Payer: Self-pay | Source: Ambulatory Visit | Attending: Medical Genetics | Admitting: Medical Genetics

## 2024-09-05 ENCOUNTER — Other Ambulatory Visit (HOSPITAL_COMMUNITY): Payer: Self-pay

## 2024-09-05 ENCOUNTER — Emergency Department (HOSPITAL_COMMUNITY)
Admission: EM | Admit: 2024-09-05 | Discharge: 2024-09-05 | Disposition: A | Discharge status: Home / Self Care | Attending: Emergency Medicine | Admitting: Emergency Medicine

## 2024-09-05 DIAGNOSIS — R569 Unspecified convulsions: Secondary | ICD-10-CM | POA: Insufficient documentation

## 2024-09-05 LAB — CBC WITH DIFFERENTIAL/PLATELET
Abs Immature Granulocytes: 0.01 K/uL (ref 0.00–0.07)
Basophils Absolute: 0.1 K/uL (ref 0.0–0.1)
Basophils Relative: 1 %
Eosinophils Absolute: 0.3 K/uL (ref 0.0–0.5)
Eosinophils Relative: 4 %
HCT: 41.5 % (ref 36.0–46.0)
Hemoglobin: 13.8 g/dL (ref 12.0–15.0)
Immature Granulocytes: 0 %
Lymphocytes Relative: 31 %
Lymphs Abs: 2.1 K/uL (ref 0.7–4.0)
MCH: 31.2 pg (ref 26.0–34.0)
MCHC: 33.3 g/dL (ref 30.0–36.0)
MCV: 93.9 fL (ref 80.0–100.0)
Monocytes Absolute: 0.5 K/uL (ref 0.1–1.0)
Monocytes Relative: 7 %
Neutro Abs: 4 K/uL (ref 1.7–7.7)
Neutrophils Relative %: 57 %
Platelets: 225 K/uL (ref 150–400)
RBC: 4.42 MIL/uL (ref 3.87–5.11)
RDW: 12.8 % (ref 11.5–15.5)
WBC: 6.9 K/uL (ref 4.0–10.5)
nRBC: 0 % (ref 0.0–0.2)

## 2024-09-05 LAB — COMPREHENSIVE METABOLIC PANEL WITH GFR
ALT: 14 U/L (ref 0–44)
AST: 25 U/L (ref 15–41)
Albumin: 4.2 g/dL (ref 3.5–5.0)
Alkaline Phosphatase: 57 U/L (ref 38–126)
Anion gap: 7 (ref 5–15)
BUN: 13 mg/dL (ref 6–20)
CO2: 28 mmol/L (ref 22–32)
Calcium: 9.8 mg/dL (ref 8.9–10.3)
Chloride: 103 mmol/L (ref 98–111)
Creatinine, Ser: 0.76 mg/dL (ref 0.44–1.00)
GFR, Estimated: >60 mL/min
Glucose, Bld: 85 mg/dL (ref 70–99)
Potassium: 4.5 mmol/L (ref 3.5–5.1)
Sodium: 138 mmol/L (ref 135–145)
Total Bilirubin: 0.4 mg/dL (ref 0.0–1.2)
Total Protein: 7 g/dL (ref 6.5–8.1)

## 2024-09-05 LAB — PHOSPHORUS: Phosphorus: 3.4 mg/dL (ref 2.5–4.6)

## 2024-09-05 LAB — MAGNESIUM: Magnesium: 2.4 mg/dL (ref 1.7–2.4)

## 2024-09-05 MED ORDER — LEVETIRACETAM (KEPPRA) 500 MG/5 ML ADULT IV PUSH
1500.0000 mg | Freq: Once | INTRAVENOUS | Status: AC
  Administered 2024-09-05: 1500 mg via INTRAVENOUS
  Filled 2024-09-05: qty 15

## 2024-09-05 MED ORDER — LEVETIRACETAM 500 MG PO TABS
500.0000 mg | ORAL_TABLET | Freq: Two times a day (BID) | ORAL | 0 refills | Status: AC
  Filled 2024-09-05: qty 60, 30d supply, fill #0

## 2024-09-05 NOTE — ED Triage Notes (Signed)
 Patient had seizure while having blood drawn in another part of hospital.  Patient stopped Keppra  a couple years ago and had a seizure two days ago and again today.  Patient denies substance or alcohol use.  Patient dud not sustain any other injuries during episode./

## 2024-09-05 NOTE — Care Management (Addendum)
 " MATCH Medication Assistance Card *Pharmacies please call [phone] for claim processing assistance Name: Brittany Hull                                                                                                                                                                                         Relationship Code:  1 ID (MRN): [id number]                                                                                                                                                                                   Person Code:  [id number] Bin: [id number] RX Group: [id number] Discharge Date: 09/05/2024                                 RX PCN:  [id number] Expiration Date:09/12/2024                                           (must be filled within 7 days of discharge)     You have been approved to have the prescriptions written by your discharging physician filled through our [facility] (Medication Assistance Through [facility]) program. This program allows for a one-time (no refills) 34-day supply of selected medications for a low copay amount.  The copay is $0 per prescription.   Only certain pharmacies are participating in this program with [facility]. You will need to select one of the pharmacies from the attached list and take your prescriptions, this letter, and your photo ID to one of  the [facility] pharmacies.  We are excited that you are able to use the [facility] program to get your medications. These  prescriptions must be filled within 7 days of hospital discharge or they will no longer be valid for the [facility] program. Should you have any problems with your prescriptions please contact your case management team member at [phone] for [facility].  Thank you,   [facility]   "

## 2024-09-05 NOTE — ED Provider Notes (Signed)
 " [place] EMERGENCY DEPARTMENT AT [facility] Provider Note   CSN: [id number] Arrival date & time: 09/05/24  1321     Patient presents with: Seizures   Brittany Hull is a 54 y.o. female.   Patient is a 54 year old female with a past medical history of seizures presenting to the emergency department after a seizure.  She states that she was getting outpatient blood work done today and after the blood draw the next thing she knew she woke up in the ambulance.  Her husband here with her reports that he saw her have a seizure with shaking of her arms.  He states that lasted about 7 to 8 minutes before stopping on its own followed by several minutes of drowsiness before she woke up and was back to her normal.  He states that he and the phlebotomist caught her so she did not fall or hit her head.  She states that she is not having any pain.  She denies any numbness or weakness.  She states that she was previously on Keppra  but has not taken this for several years because she lost her insurance and has been unable to afford it.  She states that she does occasionally have small seizures but has not had a prolonged seizure like this in several years.  She denies any history of drug or alcohol use.  The history is provided by the patient and the spouse.       Prior to Admission medications  Medication Sig Start Date End Date Taking? Authorizing Provider  ACAI BERRY PO Take 1 tablet by mouth 3 (three) times daily with meals. Patient not taking: Reported on 12/02/2023    [provider]  ibuprofen (ADVIL,MOTRIN) 200 MG tablet Take 400 mg by mouth every 6 (six) hours as needed for fever or headache. Patient not taking: Reported on 12/02/2023    [provider]  levETIRAcetam  (KEPPRA ) 500 MG tablet Take 1 tablet (500 mg total) by mouth 2 (two) times daily. 09/05/24   Kingsley, Maniah Nading K, DO  Multiple Vitamin (MULTIVITAMIN) tablet Take 1 tablet by mouth daily.    [provider]  penicillin  v potassium (VEETID) 500 MG tablet Take 1 tablet (500 mg total) by mouth 3 (three) times daily. Patient not taking: Reported on 12/02/2023 06/28/18   Baxter Drivers, MD    Allergies: Patient has no known allergies.    Review of Systems  Updated Vital Signs BP 132/73 (BP Location: Right Arm)   Pulse (!) 50   Temp 98.1 F (36.7 C) (Oral)   Resp 16   LMP 09/27/2015   SpO2 95%   Physical Exam Vitals and nursing note reviewed.  Constitutional:      General: She is not in acute distress.    Appearance: Normal appearance.  HENT:     Head: Normocephalic and atraumatic.     Nose: Nose normal.     Mouth/Throat:     Mouth: Mucous membranes are moist.     Pharynx: Oropharynx is clear.     Comments: No evidence of tongue bite Eyes:     Extraocular Movements: Extraocular movements intact.     Conjunctiva/sclera: Conjunctivae normal.     Pupils: Pupils are equal, round, and reactive to light.  Cardiovascular:     Rate and Rhythm: Normal rate and regular rhythm.     Heart sounds: Normal heart sounds.  Pulmonary:     Effort: Pulmonary effort is normal.     Breath sounds:  Normal breath sounds.  Abdominal:     General: Abdomen is flat.     Palpations: Abdomen is soft.     Tenderness: There is no abdominal tenderness.  Musculoskeletal:        General: Normal range of motion.     Cervical back: Normal range of motion and neck supple.  Skin:    General: Skin is warm and dry.  Neurological:     General: No focal deficit present.     Mental Status: She is alert and oriented to person, place, and time.  Psychiatric:        Mood and Affect: Mood normal.        Behavior: Behavior normal.     (all labs ordered are listed, but only abnormal results are displayed) Labs Reviewed  COMPREHENSIVE METABOLIC PANEL WITH GFR  CBC WITH DIFFERENTIAL/PLATELET  MAGNESIUM  PHOSPHORUS  CBG MONITORING, ED    EKG: None  Radiology: No results found.   Procedures    Medications Ordered in the ED  levETIRAcetam  (KEPPRA ) undiluted injection 1,500 mg (1,500 mg Intravenous Given 09/05/24 1356)    Clinical Course as of 09/05/24 1502  Tue Sep 05, 2024  1343 I was notified by RN that patient had an episode of unresponsiveness. States she was staring straight off into space and not responding. Lasted only a few seconds and resolved by the time I arrived in the room. Patient able to tell me her name, appears slightly more confused than when she first arrived in the ED. IV access has been obtained and will start Keppra  and place on seizure precautions and closely monitor.  [VK]  1442 Labs within normal range, TOC was able to arrange medication assistance to refill keppra . Will be given outpatient neuro referral.  [VK]    Clinical Course User Index [VK] Kingsley, Ornella Coderre K, DO                                 Medical Decision Making This patient presents to the ED with chief complaint(s) of seizure with pertinent past medical history of seizures, med noncompliance which further complicates the presenting complaint. The complaint involves an extensive differential diagnosis and also carries with it a high risk of complications and morbidity.    The differential diagnosis includes breakthrough seizure, medication noncompliance, electrolyte derangement, intoxication versus withdrawal, new signs of any trauma or injury, psychogenic nonepileptic seizure  Additional history obtained: Additional history obtained from spouse Records reviewed N/A  ED Course and Reassessment: On patient's arrival she is hemodynamically stable in no acute distress and appears to be back at her neurologic baseline.  Patient will have labs to evaluate for possible etiology of her seizure though suspect is likely from being off her medications.  Will be loaded with Keppra  and monitored for any recurrence of seizure.  Will additionally discussed with TOC for assistance with refilling her  Keppra .  Independent labs interpretation:  The following labs were independently interpreted: within normal range  Independent visualization of imaging: - N/A  Consultation: - Consulted or discussed management/test interpretation w/ external professional: TOC  Consideration for admission or further workup: Patient has no emergent conditions requiring admission or further work-up at this time and is stable for discharge home with primary care follow-up  Social Determinants of health: unable to afford medications    Amount and/or Complexity of Data Reviewed Labs: ordered.  Risk Prescription drug management.  Final diagnoses:  Seizure [facility])    ED Discharge Orders          Ordered    levETIRAcetam  (KEPPRA ) 500 MG tablet  2 times daily        09/05/24 1414    Ambulatory referral to Neurology       Comments: An appointment is requested in approximately: 2 weeks   09/05/24 1500               Kingsley, Sadik Piascik K, DO 09/05/24 1502  "

## 2024-09-05 NOTE — Discharge Instructions (Signed)
 You were seen in the emergency department after your seizure.  Your blood work here was normal.  We did give you a refill to restart you on your Keppra  and a referral to follow-up with neurology for further management of your seizures.  You should return to the emergency department if you are having prolonged seizures lasting more than 5 to 10 minutes, you have back-to-back seizures without returning to your normal self in between, you injure yourself during a seizure or if you have any other new or concerning symptoms.

## 2024-09-05 NOTE — Progress Notes (Signed)
" °   09/05/24 1417  TOC ED Mini Assessment  TOC Time spent with patient (minutes): 20  Means of departure Car  Choice offered to / list presented to  NA      "

## 2024-09-06 ENCOUNTER — Other Ambulatory Visit (HOSPITAL_COMMUNITY): Payer: Self-pay

## 2024-09-12 ENCOUNTER — Inpatient Hospital Stay: Payer: Self-pay | Admitting: Nurse Practitioner

## 2024-09-15 LAB — GENECONNECT MOLECULAR SCREEN: Genetic Analysis Overall Interpretation: NEGATIVE

## 2024-09-25 ENCOUNTER — Inpatient Hospital Stay: Admitting: Nurse Practitioner

## 2024-10-04 ENCOUNTER — Inpatient Hospital Stay: Payer: Self-pay | Admitting: Nurse Practitioner
